# Patient Record
Sex: Female | Born: 1960 | Race: White | Hispanic: No | Marital: Single | Smoking: Current every day smoker
Health system: Southern US, Community
[De-identification: ages and names within clinical notes are randomized; demographics above are authoritative.]

## PROBLEM LIST (undated history)

## (undated) DIAGNOSIS — F419 Anxiety disorder, unspecified: Secondary | ICD-10-CM

## (undated) DIAGNOSIS — I10 Essential (primary) hypertension: Secondary | ICD-10-CM

## (undated) DIAGNOSIS — F32A Depression, unspecified: Secondary | ICD-10-CM

## (undated) DIAGNOSIS — F329 Major depressive disorder, single episode, unspecified: Secondary | ICD-10-CM

## (undated) HISTORY — PX: TUBAL LIGATION: SHX77

## (undated) HISTORY — PX: TONSILLECTOMY: SUR1361

## (undated) HISTORY — PX: INNER EAR SURGERY: SHX679

---

## 2011-12-22 ENCOUNTER — Emergency Department (HOSPITAL_COMMUNITY)
Admission: EM | Admit: 2011-12-22 | Discharge: 2011-12-22 | Disposition: A | Discharge status: Home / Self Care | Payer: Worker's Compensation | Attending: Emergency Medicine | Admitting: Emergency Medicine

## 2011-12-22 ENCOUNTER — Encounter (HOSPITAL_COMMUNITY): Payer: Self-pay

## 2011-12-22 DIAGNOSIS — S51809A Unspecified open wound of unspecified forearm, initial encounter: Secondary | ICD-10-CM | POA: Insufficient documentation

## 2011-12-22 DIAGNOSIS — W278XXA Contact with other nonpowered hand tool, initial encounter: Secondary | ICD-10-CM | POA: Insufficient documentation

## 2011-12-22 DIAGNOSIS — S51819A Laceration without foreign body of unspecified forearm, initial encounter: Secondary | ICD-10-CM

## 2011-12-22 DIAGNOSIS — Z23 Encounter for immunization: Secondary | ICD-10-CM | POA: Insufficient documentation

## 2011-12-22 DIAGNOSIS — I10 Essential (primary) hypertension: Secondary | ICD-10-CM | POA: Insufficient documentation

## 2011-12-22 DIAGNOSIS — Y9269 Other specified industrial and construction area as the place of occurrence of the external cause: Secondary | ICD-10-CM | POA: Insufficient documentation

## 2011-12-22 HISTORY — DX: Major depressive disorder, single episode, unspecified: F32.9

## 2011-12-22 HISTORY — DX: Depression, unspecified: F32.A

## 2011-12-22 HISTORY — DX: Essential (primary) hypertension: I10

## 2011-12-22 HISTORY — DX: Anxiety disorder, unspecified: F41.9

## 2011-12-22 MED ORDER — TETANUS-DIPHTH-ACELL PERTUSSIS 5-2.5-18.5 LF-MCG/0.5 IM SUSP
0.5000 mL | Freq: Once | INTRAMUSCULAR | Status: AC
  Administered 2011-12-22: 0.5 mL via INTRAMUSCULAR
  Filled 2011-12-22: qty 0.5

## 2011-12-22 MED ORDER — LIDOCAINE-EPINEPHRINE (PF) 1 %-1:200000 IJ SOLN
INTRAMUSCULAR | Status: AC
  Administered 2011-12-22: 10 mL via INTRAMUSCULAR
  Filled 2011-12-22: qty 10

## 2011-12-22 NOTE — ED Provider Notes (Signed)
Medical screening examination/treatment/procedure(s) were performed by non-physician practitioner and as supervising physician I was immediately available for consultation/collaboration.   Karimah Winquist W Payslie Mccaig, MD 12/22/11 1615 

## 2011-12-22 NOTE — ED Provider Notes (Signed)
History     CSN: [id number]  Arrival date & time 12/22/11  0801   First MD Initiated Contact with Carol Todd 12/22/11 [phone]      Chief Complaint  Carol Todd presents with  . Extremity Laceration    left forearm    (Consider location/radiation/quality/duration/timing/severity/associated sxs/prior treatment) Carol Todd is a 51 y.o. female presenting with skin laceration. The history is provided by the Carol Todd.  Laceration  The incident occurred less than 1 hour ago. The laceration is located on the right arm. The laceration is 3 cm in size. Injury mechanism: a scissors at work. The pain is at a severity of 3/10. The pain is mild. The pain has been constant since onset. She reports no foreign bodies present. Her tetanus status is out of date.    Past Medical History  Diagnosis Date  . Hypertension   . Depressed   . Anxiety     Past Surgical History  Procedure Date  . Inner ear surgery   . Tubal ligation   . Tonsillectomy     No family history on file.  History  Substance Use Topics  . Smoking status: Current Everyday Smoker  . Smokeless tobacco: Not on file  . Alcohol Use: Yes    OB History    Grav Para Term Preterm Abortions TAB SAB Ect Mult Living                  Review of Systems  Constitutional: Negative for fever.  HENT: Negative.  Negative for sore throat.   Eyes: Negative.   Respiratory: Negative for shortness of breath.   Cardiovascular: Negative for chest pain.  Gastrointestinal: Negative for abdominal pain.  Genitourinary: Negative.   Musculoskeletal: Negative for joint swelling and arthralgias.  Skin: Positive for wound.  Neurological: Negative for weakness and numbness.  Hematological: Negative.   Psychiatric/Behavioral: Negative.     Allergies  Review of Carol Todd's allergies indicates no known allergies.  Home Medications  No current outpatient prescriptions on file.  BP 162/92  Pulse 110  Temp(Src) 98.5 F (36.9 C) (Oral)  Resp 18  Ht 5\' 7"   (1.702 m)  Wt 200 lb (90.719 kg)  BMI 31.32 kg/m2  SpO2 97%  Physical Exam  Nursing note and vitals reviewed. Constitutional: She is oriented to person, place, and time. She appears well-developed and well-nourished.  HENT:  Head: Normocephalic and atraumatic.  Eyes: Conjunctivae are normal.  Neck: Neck supple.  Cardiovascular: Normal rate and intact distal pulses.        Vital signs reviewed,  Not tachy at time of exam.  Pulmonary/Chest: Effort normal.  Musculoskeletal: Normal range of motion.  Neurological: She is alert and oriented to person, place, and time.  Skin: Skin is warm and dry.       3 cm laceration left mid volar forearm.  Slight oozing blood from one end of wound.  Opposite end of wound moderate depth abrasion,  Hemostatic.  Psychiatric: She has a normal mood and affect.    ED Course  LACERATION REPAIR Date/Time: 12/22/2011 8:49 AM Performed by: Glynna Failla L Authorized by: Candis Musa Consent: Verbal consent obtained. Risks and benefits: risks, benefits and alternatives were discussed Consent given by: Carol Todd Carol Todd understanding: Carol Todd states understanding of the procedure being performed Time out: Immediately prior to procedure a "time out" was called to verify the correct Carol Todd, procedure, equipment, support staff and site/side marked as required. Body area: upper extremity Location details: left lower arm Laceration length: 3 cm Foreign  bodies: no foreign bodies Tendon involvement: none Nerve involvement: none Vascular damage: no Anesthesia: local infiltration Local anesthetic: lidocaine 2% with epinephrine Anesthetic total: 3 ml Preparation: Carol Todd was prepped and draped in the usual sterile fashion. Irrigation solution: saline Irrigation method: syringe Amount of cleaning: standard Debridement: none Degree of undermining: none Skin closure: 4-0 nylon Number of sutures: 6 Technique: simple Approximation: close Approximation difficulty:  simple Dressing: xeroform applied over wound due to abraded quality of  one end of the wound.   Carol Todd tolerance: Carol Todd tolerated the procedure well with no immediate complications.   (including critical care time)  Labs Reviewed - No data to display No results found.   1. Laceration of forearm without complication       MDM  Suture removal in 10 days,  Return sooner for any complications.        Candis Musa, PA 12/22/11 [phone]

## 2011-12-22 NOTE — Discharge Instructions (Signed)

## 2011-12-22 NOTE — ED Notes (Signed)
Pt at work and scissors "grazed my skin" laceration present to left forearm. Bleeding controlled at present moment.

## 2011-12-22 NOTE — ED Notes (Signed)
Discharge instructions reviewed with pt, questions answered. Pt verbalized understanding.  

## 2011-12-22 NOTE — ED Notes (Signed)
Pt taken to lab for drug screen.

## 2021-04-21 ENCOUNTER — Other Ambulatory Visit: Payer: Self-pay

## 2021-04-21 ENCOUNTER — Emergency Department (HOSPITAL_COMMUNITY)

## 2021-04-21 ENCOUNTER — Emergency Department (HOSPITAL_COMMUNITY)
Admission: EM | Admit: 2021-04-21 | Discharge: 2021-04-22 | Disposition: A | Discharge status: Home / Self Care | Attending: Emergency Medicine | Admitting: Emergency Medicine

## 2021-04-21 DIAGNOSIS — Z7982 Long term (current) use of aspirin: Secondary | ICD-10-CM | POA: Diagnosis not present

## 2021-04-21 DIAGNOSIS — S82851A Displaced trimalleolar fracture of right lower leg, initial encounter for closed fracture: Secondary | ICD-10-CM | POA: Insufficient documentation

## 2021-04-21 DIAGNOSIS — Y92410 Unspecified street and highway as the place of occurrence of the external cause: Secondary | ICD-10-CM | POA: Insufficient documentation

## 2021-04-21 DIAGNOSIS — F17200 Nicotine dependence, unspecified, uncomplicated: Secondary | ICD-10-CM | POA: Insufficient documentation

## 2021-04-21 DIAGNOSIS — I10 Essential (primary) hypertension: Secondary | ICD-10-CM | POA: Insufficient documentation

## 2021-04-21 DIAGNOSIS — W1781XA Fall down embankment (hill), initial encounter: Secondary | ICD-10-CM | POA: Diagnosis not present

## 2021-04-21 DIAGNOSIS — S99911A Unspecified injury of right ankle, initial encounter: Secondary | ICD-10-CM | POA: Diagnosis present

## 2021-04-21 MED ORDER — FENTANYL CITRATE (PF) 100 MCG/2ML IJ SOLN
INTRAMUSCULAR | Status: AC | PRN
  Administered 2021-04-21: 100 ug via INTRAVENOUS

## 2021-04-21 MED ORDER — ETOMIDATE 2 MG/ML IV SOLN
INTRAVENOUS | Status: AC | PRN
  Administered 2021-04-21: 13.6 mg via INTRAVENOUS

## 2021-04-21 MED ORDER — FENTANYL CITRATE (PF) 100 MCG/2ML IJ SOLN
100.0000 ug | Freq: Once | INTRAMUSCULAR | Status: AC
  Administered 2021-04-21: 100 ug via INTRAVENOUS
  Filled 2021-04-21: qty 2

## 2021-04-21 MED ORDER — ETOMIDATE 2 MG/ML IV SOLN
0.1500 mg/kg | Freq: Once | INTRAVENOUS | Status: AC
  Administered 2021-04-21: 13.6 mg via INTRAVENOUS
  Filled 2021-04-21: qty 10

## 2021-04-21 MED ORDER — OXYCODONE-ACETAMINOPHEN 5-325 MG PO TABS: 1.0000 | ORAL_TABLET | Freq: Four times a day (QID) | ORAL | 0 refills | Status: DC | PRN

## 2021-04-21 MED ORDER — OXYCODONE-ACETAMINOPHEN 5-325 MG PO TABS: 1.0000 | ORAL_TABLET | ORAL | 0 refills | Status: DC | PRN

## 2021-04-21 NOTE — ED Provider Notes (Signed)
[name]   CSN: [id number] Arrival date & time: 04/21/21  1952     History Chief Complaint  Patient presents with   Marletta Lor    Carol Todd is a 60 y.o. female.  HPI Patient was standing on the side of the road, feeding deer when she slipped and injured her right ankle.  She denies other injuries.  She was unable to bear weight on the right foot secondary to pain after the accident.  Injury occurred just prior to arrival.  There are no other known active modifying factors.    Past Medical History:  Diagnosis Date   Anxiety    Depressed    Hypertension     There are no problems to display for this patient.   Past Surgical History:  Procedure Laterality Date   INNER EAR SURGERY     TONSILLECTOMY     TUBAL LIGATION       OB History   No obstetric history on file.     No family history on file.  Social History   Tobacco Use   Smoking status: Every Day  Substance Use Topics   Alcohol use: Yes   Drug use: No    Home Medications Prior to Admission medications   Medication Sig Start Date End Date Taking? Authorizing Provider  Aspirin-Salicylamide-Caffeine (BC HEADACHE POWDER PO) Take 1 Package by mouth daily.   Yes [provider]  Black Pepper-Turmeric (TURMERIC COMPLEX/BLACK PEPPER PO) Take by mouth.   Yes [provider]  co-enzyme Q-10 30 MG capsule Take 30 mg by mouth 3 (three) times daily.   Yes [provider]  Coconut Oil 1000 MG CAPS Take 1 capsule by mouth daily.   Yes [provider]  Multiple Vitamin (MULTIVITAMIN WITH MINERALS) TABS tablet Take 1 tablet by mouth daily.   Yes [provider]  oxyCODONE-acetaminophen (PERCOCET/ROXICET) 5-325 MG tablet Take 1 tablet by mouth every 6 (six) hours as needed for severe pain or moderate pain. 04/21/21  Yes Mancel Bale, MD  [facility] Wort 1000 MG CAPS Take 1 capsule by mouth daily.   Yes [provider]     Allergies    Patient has no known allergies.  Review of Systems   Review of Systems  All other systems reviewed and are negative.  Physical Exam Updated Vital Signs BP (!) 162/96   Pulse 89   Temp 99 F (37.2 C) (Oral)   Resp 18   Ht 5\' 7"  (1.702 m)   Wt 90.7 kg   SpO2 100%   BMI 31.32 kg/m   Physical Exam Vitals and nursing note reviewed.  Constitutional:      General: She is not in acute distress.    Appearance: She is well-developed. She is not ill-appearing, toxic-appearing or diaphoretic.  HENT:     Head: Normocephalic and atraumatic.     Right Ear: External ear normal.     Left Ear: External ear normal.  Eyes:     Conjunctiva/sclera: Conjunctivae normal.     Pupils: Pupils are equal, round, and reactive to light.  Neck:     Trachea: Phonation normal.  Cardiovascular:     Rate and Rhythm: Normal rate.  Pulmonary:     Effort: Pulmonary effort is normal.  Abdominal:     General: There is no distension.     Palpations: Abdomen is soft.  Musculoskeletal:     Cervical back: Normal range of motion and neck supple.  Comments: Tender and swollen right ankle.  Unable to move ankle secondary to pain.  No gross deformity.  Neurovascular intact distally in the right toes.  No other large joint injury of either arm or leg  Skin:    General: Skin is warm and dry.  Neurological:     Mental Status: She is alert and oriented to person, place, and time.     Cranial Nerves: No cranial nerve deficit.     Sensory: No sensory deficit.     Motor: No abnormal muscle tone.     Coordination: Coordination normal.  Psychiatric:        Mood and Affect: Mood normal.        Behavior: Behavior normal.        Thought Content: Thought content normal.        Judgment: Judgment normal.    ED Results / Procedures / Treatments   Labs (all labs ordered are listed, but only abnormal results are displayed) Labs Reviewed - No data to display  EKG None  Radiology DG Ankle  Complete Right  Result Date: 04/21/2021 CLINICAL DATA:  60 year old female with fall and trauma to the right ankle. EXAM: RIGHT ANKLE - COMPLETE 3+ VIEW COMPARISON:  None. FINDINGS: There is a fracture of the medial malleolus with approximately 8 mm lateral displacement of the distal fracture fragment. Fracture of the distal fibula with approximately 12 mm lateral displacement of the distal fracture fragment. There is mildly displaced fracture of the posterior malleolus. There is posterior tilting of the tibial plafond in relation to the talar dome with asymmetric widening of the anterior ankle mortise. The bones are well mineralized. No significant arthritic changes. There is soft tissue swelling of the ankle. No radiopaque foreign object or soft tissue gas. IMPRESSION: Displaced trimalleolar fracture with asymmetric widening of the anterior ankle mortise. Electronically Signed   By: Elgie CollardArash  Radparvar M.D.   On: 04/21/2021 20:41    Procedures .Sedation  Date/Time: 04/21/2021 11:08 PM Performed by: Mancel BaleWentz, Kiyon Fidalgo, MD Authorized by: Mancel BaleWentz, Gerturde Kuba, MD   Consent:    Consent obtained:  Written   Consent given by:  Patient   Risks discussed:  Respiratory compromise necessitating ventilatory assistance and intubation   Alternatives discussed:  Analgesia without sedation Universal protocol:    Immediately prior to procedure, a time out was called: yes   Indications:    Procedure performed:  Fracture reduction   Procedure necessitating sedation performed by:  Physician performing sedation Pre-sedation assessment:    Time since last food or drink:  5 hour   ASA classification: class 2 - patient with mild systemic disease     Mouth opening:  3 or more finger widths   Mallampati score:  II - soft palate, uvula, fauces visible   Neck mobility: normal     Pre-sedation assessments completed and reviewed: airway patency, cardiovascular function, hydration status, mental status and respiratory function      Pre-sedation assessments completed and reviewed: pre-procedure nausea and vomiting status not reviewed and pre-procedure pain level not reviewed     Pre-sedation assessment completed:  04/21/2021 11:50 PM Immediate pre-procedure details:    Reassessment: Patient reassessed immediately prior to procedure     Reviewed: vital signs     Verified: bag valve mask available, emergency equipment available, intubation equipment available, IV patency confirmed and oxygen available   Procedure details (see MAR for exact dosages):    Preoxygenation:  Nasal cannula   Sedation:  Etomidate   Analgesia:  Fentanyl   Intra-procedure monitoring:  Blood pressure monitoring, cardiac monitor, continuous capnometry, continuous pulse oximetry, frequent LOC assessments and frequent vital sign checks   Intra-procedure events: none     Total Provider sedation time (minutes):  15 Post-procedure details:    Post-sedation assessment completed:  04/21/2021 11:09 PM   Attendance: Constant attendance by certified staff until patient recovered     Recovery: Patient returned to pre-procedure baseline     Post-sedation assessments completed and reviewed: airway patency, cardiovascular function, hydration status, mental status and respiratory function     Post-sedation assessments completed and reviewed: post-procedure nausea and vomiting status not reviewed and pain score not reviewed     Patient is stable for discharge or admission: yes     Procedure completion:  Tolerated well, no immediate complications .Ortho Injury Treatment  Date/Time: 04/21/2021 11:10 PM Performed by: Mancel Bale, MD Authorized by: Mancel Bale, MD   Consent:    Consent obtained:  Written   Consent given by:  Patient   Risks discussed:  Fracture   Alternatives discussed:  No treatmentInjury location: ankle Location details: left ankle Injury type: fracture-dislocation Fracture type: trimalleolar Pre-procedure neurovascular assessment:  neurovascularly intact Pre-procedure distal perfusion: normal Pre-procedure neurological function: normal Pre-procedure range of motion: normal Manipulation performed: yes Immobilization: splint Splint type: ankle stirrup and long leg Splint Applied by: ED Provider Supplies used: cotton padding and Ortho-Glass Post-procedure neurovascular assessment: post-procedure neurovascularly intact Post-procedure distal perfusion: normal Post-procedure neurological function: normal Post-procedure range of motion: normal     Medications Ordered in ED Medications  etomidate (AMIDATE) injection 13.6 mg (13.6 mg Intravenous Given 04/21/21 2255)  fentaNYL (SUBLIMAZE) injection 100 mcg (100 mcg Intravenous Given 04/21/21 2256)  fentaNYL (SUBLIMAZE) injection (100 mcg Intravenous Given 04/21/21 2257)  etomidate (AMIDATE) injection (13.6 mg Intravenous Given 04/21/21 2300)    ED Course  I have reviewed the triage vital signs and the nursing notes.  Pertinent labs & imaging results that were available during my care of the patient were reviewed by me and considered in my medical decision making (see chart for details).    MDM Rules/Calculators/A&P                            Patient Vitals for the past 24 hrs:  BP Temp Temp src Pulse Resp SpO2 Height Weight  04/21/21 2315 (!) 162/96 -- -- 89 18 100 % -- --  04/21/21 2311 (!) 195/83 -- -- 85 19 97 % -- --  04/21/21 2308 -- -- -- 85 -- 99 % -- --  04/21/21 2306 (!) 195/87 -- -- 93 -- 97 % -- --  04/21/21 2304 -- -- -- 88 -- 98 % -- --  04/21/21 2303 -- -- -- (!) 105 -- 100 % -- --  04/21/21 2300 (!) 198/172 -- -- 99 (!) 121 98 % -- --  04/21/21 2239 (!) 172/78 -- -- 96 -- 99 % -- --  04/21/21 2224 (!) 170/90 -- -- 89 20 100 % -- --  04/21/21 2215 (!) 170/90 -- -- 89 -- 93 % -- --  04/21/21 2001 (!) 187/90 99 F (37.2 C) Oral 88 18 97 % -- --  04/21/21 1954 -- -- -- -- -- -- 5\' 7"  (1.702 m) 90.7 kg    Medical Decision Making:  This patient  is presenting for evaluation of right ankle injury, which does require a range of treatment options, and is a complaint that involves  a moderate risk of morbidity and mortality. The differential diagnoses include fracture, contusion, sprain. I decided to review old records, and in summary healthy middle-aged female presenting with isolated injury to the right ankle caused by a slip and fall.  I did not require additional historical information from anyone.   Radiologic Tests Ordered, included right ankle x-ray, CT ankle post reduction.  I independently Visualized: Radiograph images, which show trimalleolar fracture   Critical Interventions-clinical evaluation, radiography, discussion with orthopedics, reduction of fracture dislocation, additional imaging  After These Interventions, the Patient was reevaluated and was found with trimalleolar fracture right, requiring reduction.  CRITICAL CARE-no Performed by: Mancel Bale  Nursing Notes Reviewed/ Care Coordinated Applicable Imaging Reviewed Interpretation of Laboratory Data incorporated into ED treatment   Disposition by oncoming team following CT imaging.  Anticipate discharge home, nonweightbearing and follow-up with orthopedics in the next day or 2    Final Clinical Impression(s) / ED Diagnoses Final diagnoses:  Closed trimalleolar fracture of right ankle, initial encounter    Rx / DC Orders ED Discharge Orders          Ordered    oxyCODONE-acetaminophen (PERCOCET) 5-325 MG tablet  Every 4 hours PRN,   Status:  Discontinued        04/21/21 2336    oxyCODONE-acetaminophen (PERCOCET/ROXICET) 5-325 MG tablet  Every 6 hours PRN        04/21/21 2338             Mancel Bale, MD 04/22/21 1119

## 2021-04-21 NOTE — ED Triage Notes (Signed)
BIB [facility] after sliding down a hill while feeding the deer. Obvious deformity to R ankle, splinted with EMS.

## 2021-04-21 NOTE — ED Notes (Signed)
Pt back from CT

## 2021-04-21 NOTE — ED Notes (Signed)
Patient transported to CT 

## 2021-04-21 NOTE — Discharge Instructions (Addendum)
Keep the right ankle elevate above your heart as much as possible. Call the orthopedic doctor, tomorrow morning to arrange a follow-up appointment for further care and treatment.  You will likely need surgery.

## 2021-04-22 NOTE — ED Provider Notes (Signed)
  Physical Exam  BP (!) 165/78   Pulse 71   Temp 99 F (37.2 C) (Oral)   Resp 18   Ht 5\' 7"  (1.702 m)   Wt 90.7 kg   SpO2 99%   BMI 31.32 kg/m   Physical Exam Vitals and nursing note reviewed.  Constitutional:      Appearance: Normal appearance.  HENT:     Head: Normocephalic.  Pulmonary:     Effort: Pulmonary effort is normal.  Musculoskeletal:     Comments: Motor, sensation, and capillary refill intact to all toes.  Neurological:     Mental Status: She is alert.    ED Course/Procedures     Procedures  MDM  Care assumed from Dr. at shift change.  Patient awaiting results of a CT scan which was obtained at the request of the orthopedic surgeon for her trimalleolar ankle fracture.  This has been performed and shows some improvement of the fracture.  Patient reassessed and tells me her foot actually feels better.  She is able to move her toes without discomfort.  She has sensation and capillary refill is brisk to all toes.  Patient seems appropriate for discharge with orthopedic follow-up.       Effie Shy, MD 04/22/21 [id number]

## 2021-04-26 ENCOUNTER — Ambulatory Visit: Payer: Self-pay | Admitting: Orthopedic Surgery

## 2021-04-30 ENCOUNTER — Encounter: Payer: Self-pay | Admitting: Orthopedic Surgery

## 2021-04-30 ENCOUNTER — Other Ambulatory Visit: Payer: Self-pay

## 2021-04-30 ENCOUNTER — Ambulatory Visit (INDEPENDENT_AMBULATORY_CARE_PROVIDER_SITE_OTHER): Admitting: Orthopedic Surgery

## 2021-04-30 VITALS — BP 146/70 | HR 98

## 2021-04-30 DIAGNOSIS — S82851A Displaced trimalleolar fracture of right lower leg, initial encounter for closed fracture: Secondary | ICD-10-CM | POA: Diagnosis not present

## 2021-05-01 ENCOUNTER — Encounter: Payer: Self-pay | Admitting: Orthopedic Surgery

## 2021-05-01 NOTE — H&P (View-Only) (Signed)
New Patient Visit  Assessment: Carol Todd is a 60 y.o. female with the following: Right trimalleolar ankle fracture  Plan: Radiographs were reviewed with the patient in clinic today.  She had a reduction in the emergency department of her right ankle fracture dislocation.  She feels well overall.  Her pain is controlled.  Based on the radiographs of the injury, I recommended surgery.  She is in agreement with this plan.  Risks and benefits of surgery, including, but not limited to infection, bleeding, persistent pain, damage to surrounding structures, need for further surgery, malunion, nonunion and more severe complications associated with anesthesia were discussed.  All questions have been answered and she has elected to proceed with surgery.   Patient is a smoker.  She is aware that this can increase the risk for nonunion or poor result overall.  She states she will work to decrease the amount she is smoking around the time of surgery.  Surgical Plan  Procedure: Operative fixation of right trimalleolar ankle fracture Disposition: Ambulatory Anesthesia: General with a block Medical Comorbidities: Tobacco use; no additional medical issues, although she has not followed up with a medical doctor in several years.     Follow-up: Return for After surgery; DOS 05/10/2021.  Subjective:  Chief Complaint  Patient presents with   Ankle Injury    DOI 04/21/21/fx ankle right    History of Present Illness: Carol Todd is a 60 y.o. female who presents for evaluation after a right ankle injury.  Approximately 1 week ago, she states that she was walking down a hill to feed some deer [place] a small farm, when she slipped and fell.  She noted immediate pain and deformity to the right ankle.  She presents to the emergency department for evaluation, where she was diagnosed with a fracture dislocation.  Her right ankle was reduced and placed in a splint.  Since then, she has done well.  Her pain  has improved.  She has not been bearing weight.  No issues with the splint.  No injuries elsewhere.   Review of Systems: No fevers or chills No numbness or tingling No chest pain No shortness of breath No bowel or bladder dysfunction No GI distress No headaches   Medical History:  Past Medical History:  Diagnosis Date   Anxiety    Depressed    Hypertension     Past Surgical History:  Procedure Laterality Date   INNER EAR SURGERY     TONSILLECTOMY     TUBAL LIGATION      No family history on file. Social History   Tobacco Use   Smoking status: Every Day   Smokeless tobacco: Never  Substance Use Topics   Alcohol use: Yes   Drug use: No    No Known Allergies  Current Meds  Medication Sig   Aspirin-Salicylamide-Caffeine (BC HEADACHE POWDER PO) Take 1 Package by mouth daily.   Black Pepper-Turmeric (TURMERIC COMPLEX/BLACK PEPPER PO) Take by mouth.   co-enzyme Q-10 30 MG capsule Take 30 mg by mouth 3 (three) times daily.   Coconut Oil 1000 MG CAPS Take 1 capsule by mouth daily.   Multiple Vitamin (MULTIVITAMIN WITH MINERALS) TABS tablet Take 1 tablet by mouth daily.   oxyCODONE-acetaminophen (PERCOCET/ROXICET) 5-325 MG tablet Take 1 tablet by mouth every 6 (six) hours as needed for severe pain or moderate pain.   [place] Wort 1000 MG CAPS Take 1 capsule by mouth daily.    Objective: BP (!) 146/70   Pulse  98   Physical Exam:  General: Alert and oriented. and No acute distress. Gait: Unable to ambulate.  Evaluation of the right ankle demonstrates a splint that is clean, dry and intact.  Exposed toes are warm and well-perfused.  She has sensation to the distal aspect of her toes.  Active motion intact in the EHL and TA.  No skin breakdown at the edges of the splint.  Some ecchymosis is appreciated over the anterior tibia.  IMAGING: I personally reviewed images previously obtained from the ED  X-ray and CT scan of the right ankle demonstrates a displaced  trimalleolar ankle fracture.  There is a large posterior malleolus fragment.  Post reduction imaging demonstrates adequate alignment.  New Medications:  No orders of the defined types were placed in this encounter.     Oliver Barre, MD  05/01/2021 7:36 AM

## 2021-05-01 NOTE — Progress Notes (Signed)
New Patient Visit  Assessment: Carol Todd is a 60 y.o. female with the following: Right trimalleolar ankle fracture  Plan: Radiographs were reviewed with the patient in clinic today.  She had a reduction in the emergency department of her right ankle fracture dislocation.  She feels well overall.  Her pain is controlled.  Based on the radiographs of the injury, I recommended surgery.  She is in agreement with this plan.  Risks and benefits of surgery, including, but not limited to infection, bleeding, persistent pain, damage to surrounding structures, need for further surgery, malunion, nonunion and more severe complications associated with anesthesia were discussed.  All questions have been answered and she has elected to proceed with surgery.   Patient is a smoker.  She is aware that this can increase the risk for nonunion or poor result overall.  She states she will work to decrease the amount she is smoking around the time of surgery.  Surgical Plan  Procedure: Operative fixation of right trimalleolar ankle fracture Disposition: Ambulatory Anesthesia: General with a block Medical Comorbidities: Tobacco use; no additional medical issues, although she has not followed up with a medical doctor in several years.     Follow-up: Return for After surgery; DOS 05/10/2021.  Subjective:  Chief Complaint  Patient presents with   Ankle Injury    DOI 04/21/21/fx ankle right    History of Present Illness: Carol Todd is a 60 y.o. female who presents for evaluation after a right ankle injury.  Approximately 1 week ago, she states that she was walking [place] when she slipped and fell.  She noted immediate pain and deformity to the right ankle.  She presents to the emergency department for evaluation, where she was diagnosed with a fracture dislocation.  Her right ankle was reduced and placed in a splint.  Since then, she has done well.  Her pain  has improved.  She has not been bearing weight.  No issues with the splint.  No injuries elsewhere.   Review of Systems: No fevers or chills No numbness or tingling No chest pain No shortness of breath No bowel or bladder dysfunction No GI distress No headaches   Medical History:  Past Medical History:  Diagnosis Date   Anxiety    Depressed    Hypertension     Past Surgical History:  Procedure Laterality Date   INNER EAR SURGERY     TONSILLECTOMY     TUBAL LIGATION      No family history on file. Social History   Tobacco Use   Smoking status: Every Day   Smokeless tobacco: Never  Substance Use Topics   Alcohol use: Yes   Drug use: No    No Known Allergies  Current Meds  Medication Sig   Aspirin-Salicylamide-Caffeine (BC HEADACHE POWDER PO) Take 1 Package by mouth daily.   Black Pepper-Turmeric (TURMERIC COMPLEX/BLACK PEPPER PO) Take by mouth.   co-enzyme Q-10 30 MG capsule Take 30 mg by mouth 3 (three) times daily.   Coconut Oil 1000 MG CAPS Take 1 capsule by mouth daily.   Multiple Vitamin (MULTIVITAMIN WITH MINERALS) TABS tablet Take 1 tablet by mouth daily.   oxyCODONE-acetaminophen (PERCOCET/ROXICET) 5-325 MG tablet Take 1 tablet by mouth every 6 (six) hours as needed for severe pain or moderate pain.   St Johns Wort 1000 MG CAPS Take 1 capsule by mouth daily.    Objective: BP (!) 146/70   Pulse  98   Physical Exam:  General: Alert and oriented. and No acute distress. Gait: Unable to ambulate.  Evaluation of the right ankle demonstrates a splint that is clean, dry and intact.  Exposed toes are warm and well-perfused.  She has sensation to the distal aspect of her toes.  Active motion intact in the EHL and TA.  No skin breakdown at the edges of the splint.  Some ecchymosis is appreciated over the anterior tibia.  IMAGING: I personally reviewed images previously obtained from the ED  X-ray and CT scan of the right ankle demonstrates a displaced  trimalleolar ankle fracture.  There is a large posterior malleolus fragment.  Post reduction imaging demonstrates adequate alignment.  New Medications:  No orders of the defined types were placed in this encounter.     [place] Hair A Theadore Blunck, MD  05/01/2021 7:36 AM    

## 2021-05-03 NOTE — Patient Instructions (Signed)
Your procedure is scheduled on: 05/10/2021  Report to [facility] Main Entrance at   6:15  AM.  Call this number if you have problems the morning of surgery: [phone]   Remember:   Do not Eat or Drink after midnight         No Smoking the morning of surgery  :  Take these medicines the morning of surgery with A SIP OF WATER: oxycodone if needed   Do not wear jewelry, make-up or nail polish.  Do not wear lotions, powders, or perfumes. You may wear deodorant.  Do not shave 48 hours prior to surgery. Men may shave face and neck.  Do not bring valuables to the hospital.  Contacts, dentures or bridgework may not be worn into surgery.  Leave suitcase in the car. After surgery it may be brought to your room.  For patients admitted to the hospital, checkout time is 11:00 AM the day of discharge.   Patients discharged the day of surgery will not be allowed to drive home.    Special Instructions: Shower using CHG night before surgery and shower the day of surgery use CHG.  Use special wash - you have one bottle of CHG for all showers.  You should use approximately 1/2 of the bottle for each shower.  How to Use Chlorhexidine for Bathing Chlorhexidine gluconate (CHG) is a germ-killing (antiseptic) solution that is used to clean the skin. It can get rid of the bacteria that normally live on the skin and can keep them away for about 24 hours. To clean your skin with CHG, you may be given: A CHG solution to use in the shower or as part of a sponge bath. A prepackaged cloth that contains CHG. Cleaning your skin with CHG may help lower the risk for infection: While you are staying in the intensive care unit of the hospital. If you have a vascular access, such as a central line, to provide short-term or long-term access to your veins. If you have a catheter to drain urine from your bladder. If you are on a ventilator. A ventilator is a machine that helps you breathe by moving air in and out of your  lungs. After surgery. What are the risks? Risks of using CHG include: A skin reaction. Hearing loss, if CHG gets in your ears. Eye injury, if CHG gets in your eyes and is not rinsed out. The CHG product catching fire. Make sure that you avoid smoking and flames after applying CHG to your skin. Do not use CHG: If you have a chlorhexidine allergy or have previously reacted to chlorhexidine. On babies younger than 39 months of age. How to use CHG solution Use CHG only as told by your health care provider, and follow the instructions on the label. Use the full amount of CHG as directed. Usually, this is one bottle. During a shower Follow these steps when using CHG solution during a shower (unless your health care provider gives you different instructions): Start the shower. Use your normal soap and shampoo to wash your face and hair. Turn off the shower or move out of the shower stream. Pour the CHG onto a clean washcloth. Do not use any type of brush or rough-edged sponge. Starting at your neck, lather your body down to your toes. Make sure you follow these instructions: If you will be having surgery, pay special attention to the part of your body where you will be having surgery. Scrub this area for at  least 1 minute. Do not use CHG on your head or face. If the solution gets into your ears or eyes, rinse them well with water. Avoid your genital area. Avoid any areas of skin that have broken skin, cuts, or scrapes. Scrub your back and under your arms. Make sure to wash skin folds. Let the lather sit on your skin for 1-2 minutes or as long as told by your health care provider. Thoroughly rinse your entire body in the shower. Make sure that all body creases and crevices are rinsed well. Dry off with a clean towel. Do not put any substances on your body afterward--such as powder, lotion, or perfume--unless you are told to do so by your health care provider. Only use lotions that are recommended  by the manufacturer. Put on clean clothes or pajamas. If it is the night before your surgery, sleep in clean sheets.  During a sponge bath Follow these steps when using CHG solution during a sponge bath (unless your health care provider gives you different instructions): Use your normal soap and shampoo to wash your face and hair. Pour the CHG onto a clean washcloth. Starting at your neck, lather your body down to your toes. Make sure you follow these instructions: If you will be having surgery, pay special attention to the part of your body where you will be having surgery. Scrub this area for at least 1 minute. Do not use CHG on your head or face. If the solution gets into your ears or eyes, rinse them well with water. Avoid your genital area. Avoid any areas of skin that have broken skin, cuts, or scrapes. Scrub your back and under your arms. Make sure to wash skin folds. Let the lather sit on your skin for 1-2 minutes or as long as told by your health care provider. Using a different clean, wet washcloth, thoroughly rinse your entire body. Make sure that all body creases and crevices are rinsed well. Dry off with a clean towel. Do not put any substances on your body afterward--such as powder, lotion, or perfume--unless you are told to do so by your health care provider. Only use lotions that are recommended by the manufacturer. Put on clean clothes or pajamas. If it is the night before your surgery, sleep in clean sheets. How to use CHG prepackaged cloths Only use CHG cloths as told by your health care provider, and follow the instructions on the label. Use the CHG cloth on clean, dry skin. Do not use the CHG cloth on your head or face unless your health care provider tells you to. When washing with the CHG cloth: Avoid your genital area. Avoid any areas of skin that have broken skin, cuts, or scrapes. Before surgery Follow these steps when using a CHG cloth to clean before surgery  (unless your health care provider gives you different instructions): Using the CHG cloth, vigorously scrub the part of your body where you will be having surgery. Scrub using a back-and-forth motion for 3 minutes. The area on your body should be completely wet with CHG when you are done scrubbing. Do not rinse. Discard the cloth and let the area air-dry. Do not put any substances on the area afterward, such as powder, lotion, or perfume. Put on clean clothes or pajamas. If it is the night before your surgery, sleep in clean sheets.  For general bathing Follow these steps when using CHG cloths for general bathing (unless your health care provider gives you different instructions).  Use a separate CHG cloth for each area of your body. Make sure you wash between any folds of skin and between your fingers and toes. Wash your body in the following order, switching to a new cloth after each step: The front of your neck, shoulders, and chest. Both of your arms, under your arms, and your hands. Your stomach and groin area, avoiding the genitals. Your right leg and foot. Your left leg and foot. The back of your neck, your back, and your buttocks. Do not rinse. Discard the cloth and let the area air-dry. Do not put any substances on your body afterward--such as powder, lotion, or perfume--unless you are told to do so by your health care provider. Only use lotions that are recommended by the manufacturer. Put on clean clothes or pajamas. Contact a health care provider if: Your skin gets irritated after scrubbing. You have questions about using your solution or cloth. Get help right away if: Your eyes become very red or swollen. Your eyes itch badly. Your skin itches badly and is red or swollen. Your hearing changes. You have trouble seeing. You have swelling or tingling in your mouth or throat. You have trouble breathing. You swallow any chlorhexidine. Summary Chlorhexidine gluconate (CHG) is a  germ-killing (antiseptic) solution that is used to clean the skin. Cleaning your skin with CHG may help to lower your risk for infection. You may be given CHG to use for bathing. It may be in a bottle or in a prepackaged cloth to use on your skin. Carefully follow your health care provider's instructions and the instructions on the product label. Do not use CHG if you have a chlorhexidine allergy. Contact your health care provider if your skin gets irritated after scrubbing. This information is not intended to replace advice given to you by your health care provider. Make sure you discuss any questions you have with your healthcare provider. Document Revised: 01/24/2020 Document Reviewed: 02/28/2020 Elsevier Patient Education  2022 [employer] Displaced Trimalleolar Ankle Fracture Treated With ORIF, Care After This sheet gives you information about how to care for yourself after your procedure. Your health care provider may also give you more specific instructions. If you have problems or questions, contact your health careprovider. What can I expect after the procedure? After the procedure, it is common to have: Pain. Swelling. A small amount of fluid from your incision. Follow these instructions at home: If you have a splint or boot: Wear the splint or boot as told by your health care provider. Remove it only as told by your health care provider. Loosen the splint or boot if your toes tingle, become numb, or turn cold and blue. Keep the splint or boot clean and dry. If you have a cast: Do not stick anything inside the cast to scratch your skin. Doing that increases your risk of infection. Check the skin around the cast every day. Tell your health care provider about any concerns. You may put lotion on dry skin around the edges of the cast. Do not put lotion on the skin underneath the cast. Keep the cast clean and dry. Bathing Do not take baths, swim, or use a hot tub until your health  care provider approves. Ask your health care provider if you can take showers. If your splint, boot, or cast is not waterproof: Do not let it get wet. Cover it with a watertight covering when you take a bath or a shower. Keep your bandage (dressing) dry until your  health care provider says it can be removed. Incision care  Follow instructions from your health care provider about how to take care of your incision. Make sure you: Wash your hands with soap and water for at least 20 seconds before and after you change your dressing. If soap and water are not available, use hand sanitizer. Change your dressing as told by your health care provider. Leave stitches (sutures), skin glue, or adhesive strips in place. These skin closures may need to stay in place for 2 weeks or longer. If adhesive strip edges start to loosen and curl up, you may trim the loose edges. Do not remove adhesive strips completely unless your health care provider tells you to do that. Check your incision area every day for signs of infection. Check for: Redness. More pain or swelling. Blood or more fluid. Warmth. Pus or a bad smell.  Managing pain, stiffness, and swelling  If directed, put ice on the affected area. To do this: If you have a removable splint or boot, remove it as told by your health care provider. Put ice in a plastic bag. Place a towel between your skin and the bag or between your cast and the bag. Leave the ice on for 20 minutes, 2-3 times a day. Remove the ice if your skin turns bright red. This is very important. If you cannot feel pain, heat, or cold, you have a greater risk of damage to the area. Move your toes often to reduce stiffness and swelling. Raise (elevate) the injured area above the level of your heart while you are sitting or lying down. To do this, try putting a few pillows under your leg and ankle.  Activity Do exercises as told by your health care provider or physical therapist. Do  not use your injured limb to support (bear) your body weight until your health care provider says that you can. Follow weight-bearing restrictions as told. Use crutches or other devices to help you move around (assistive devices) as told by your health care provider. Ask your health care provider when it is safe to drive if you have a splint, boot, or cast on your foot. Return to your normal activities as told by your health care provider. Ask your health care provider what activities are safe for you. General instructions Do not put pressure on any part of the splint or cast until it is fully hardened. This may take several hours. Do not use any products that contain nicotine or tobacco, such as cigarettes and e-cigarettes. These can delay bone healing. If you need help quitting, ask your health care provider. Take over-the-counter and prescription medicines only as told by your health care provider. Ask your health care provider if the medicine prescribed to you: Requires you to avoid driving or using machinery. Can cause constipation. You may need to take these actions to prevent or treat constipation: Drink enough fluid to keep your urine pale yellow. Take over-the-counter or prescription medicines. Eat foods that are high in fiber, such as beans, whole grains, and fresh fruits and vegetables. Limit foods that are high in fat and processed sugars, such as fried or sweet foods. Keep all follow-up visits. This is important. Contact a health care provider if: You have a fever. Your pain medicine is not helping. You have redness around your incision. You have more pain or swelling around your incision. You have blood or more fluid coming from your incision or leaking through your cast. Your incision feels warm  to the touch. You have pus or a bad smell coming from your incision or from your cast or dressing. Get help right away if: The edges of your incision come apart after the sutures or  staples have been taken out. You have chest pain or trouble breathing. You have numbness or tingling in your foot or leg. Your foot becomes cold, pale, or blue. You have pain or swelling in your calf. These symptoms may represent a serious problem that is an emergency. Do not wait to see if the symptoms will go away. Get medical help right away. Call your local emergency services (911 in the [place] Do not drive yourself to the hospital. Summary After the procedure, it is common to have some pain and swelling. If your splint, boot, or cast is not waterproof, do not let it get wet. Contact your health care provider if you have more pain or swelling, or if you have blood or more fluid coming from your incision or leaking through your cast. Get help right away if you have numbness or tingling in your foot or leg, or if your foot becomes cold, pale, or blue. This information is not intended to replace advice given to you by your health care provider. Make sure you discuss any questions you have with your healthcare provider. Document Revised: 01/13/2020 Document Reviewed: 01/13/2020 Elsevier Patient Education  2022 [employer] General Anesthesia, Adult, Care After This sheet gives you information about how to care for yourself after your procedure. Your health care provider may also give you more specific instructions. If you have problems or questions, contact your health careprovider. What can I expect after the procedure? After the procedure, the following side effects are common: Pain or discomfort at the IV site. Nausea. Vomiting. Sore throat. Trouble concentrating. Feeling cold or chills. Feeling weak or tired. Sleepiness and fatigue. Soreness and body aches. These side effects can affect parts of the body that were not involved in surgery. Follow these instructions at home: For the time period you were told by your health care provider:  Rest. Do not participate in activities  where you could fall or become injured. Do not drive or use machinery. Do not drink alcohol. Do not take sleeping pills or medicines that cause drowsiness. Do not make important decisions or sign legal documents. Do not take care of children on your own.  Eating and drinking Follow any instructions from your health care provider about eating or drinking restrictions. When you feel hungry, start by eating small amounts of foods that are soft and easy to digest (bland), such as toast. Gradually return to your regular diet. Drink enough fluid to keep your urine pale yellow. If you vomit, rehydrate by drinking water, juice, or clear broth. General instructions If you have sleep apnea, surgery and certain medicines can increase your risk for breathing problems. Follow instructions from your health care provider about wearing your sleep device: Anytime you are sleeping, including during daytime naps. While taking prescription pain medicines, sleeping medicines, or medicines that make you drowsy. Have a responsible adult stay with you for the time you are told. It is important to have someone help care for you until you are awake and alert. Return to your normal activities as told by your health care provider. Ask your health care provider what activities are safe for you. Take over-the-counter and prescription medicines only as told by your health care provider. If you smoke, do not smoke without supervision. Keep all follow-up  visits as told by your health care provider. This is important. Contact a health care provider if: You have nausea or vomiting that does not get better with medicine. You cannot eat or drink without vomiting. You have pain that does not get better with medicine. You are unable to pass urine. You develop a skin rash. You have a fever. You have redness around your IV site that gets worse. Get help right away if: You have difficulty breathing. You have chest pain. You  have blood in your urine or stool, or you vomit blood. Summary After the procedure, it is common to have a sore throat or nausea. It is also common to feel tired. Have a responsible adult stay with you for the time you are told. It is important to have someone help care for you until you are awake and alert. When you feel hungry, start by eating small amounts of foods that are soft and easy to digest (bland), such as toast. Gradually return to your regular diet. Drink enough fluid to keep your urine pale yellow. Return to your normal activities as told by your health care provider. Ask your health care provider what activities are safe for you. This information is not intended to replace advice given to you by your health care provider. Make sure you discuss any questions you have with your healthcare provider. Document Revised: 05/28/2020 Document Reviewed: 12/26/2019 Elsevier Patient Education  2022 [employer].

## 2021-05-07 ENCOUNTER — Encounter (HOSPITAL_COMMUNITY)
Admission: RE | Admit: 2021-05-07 | Discharge: 2021-05-07 | Disposition: A | Discharge status: Home / Self Care | Source: Ambulatory Visit | Attending: Orthopedic Surgery | Admitting: Orthopedic Surgery

## 2021-05-07 ENCOUNTER — Encounter (HOSPITAL_COMMUNITY): Payer: Self-pay

## 2021-05-07 ENCOUNTER — Other Ambulatory Visit: Payer: Self-pay

## 2021-05-07 DIAGNOSIS — Z01812 Encounter for preprocedural laboratory examination: Secondary | ICD-10-CM | POA: Diagnosis present

## 2021-05-07 LAB — BASIC METABOLIC PANEL
Anion gap: 8 (ref 5–15)
BUN: 12 mg/dL (ref 6–20)
CO2: 27 mmol/L (ref 22–32)
Calcium: 9.4 mg/dL (ref 8.9–10.3)
Chloride: 106 mmol/L (ref 98–111)
Creatinine, Ser: 0.75 mg/dL (ref 0.44–1.00)
GFR, Estimated: >60 mL/min (ref >60)
Glucose, Bld: 98 mg/dL (ref 70–99)
Potassium: 3.8 mmol/L (ref 3.5–5.1)
Sodium: 141 mmol/L (ref 135–145)

## 2021-05-07 LAB — CBC
HCT: 43.3 % (ref 36.0–46.0)
Hemoglobin: 14.3 g/dL (ref 12.0–15.0)
MCH: 31.4 pg (ref 26.0–34.0)
MCHC: 33 g/dL (ref 30.0–36.0)
MCV: 95 fL (ref 80.0–100.0)
Platelets: 307 10*3/uL (ref 150–400)
RBC: 4.56 MIL/uL (ref 3.87–5.11)
RDW: 12.7 % (ref 11.5–15.5)
WBC: 7 10*3/uL (ref 4.0–10.5)
nRBC: 0 % (ref 0.0–0.2)

## 2021-05-10 ENCOUNTER — Ambulatory Visit (HOSPITAL_COMMUNITY)
Admission: RE | Admit: 2021-05-10 | Discharge: 2021-05-10 | Disposition: A | Discharge status: Home / Self Care | Attending: Orthopedic Surgery | Admitting: Orthopedic Surgery

## 2021-05-10 ENCOUNTER — Encounter (HOSPITAL_COMMUNITY)
Admission: RE | Disposition: A | Discharge status: Home / Self Care | Payer: Self-pay | Source: Home / Self Care | Attending: Orthopedic Surgery

## 2021-05-10 ENCOUNTER — Ambulatory Visit (HOSPITAL_COMMUNITY)

## 2021-05-10 ENCOUNTER — Ambulatory Visit (HOSPITAL_COMMUNITY): Admitting: Anesthesiology

## 2021-05-10 ENCOUNTER — Encounter (HOSPITAL_COMMUNITY): Payer: Self-pay | Admitting: Orthopedic Surgery

## 2021-05-10 DIAGNOSIS — Z7982 Long term (current) use of aspirin: Secondary | ICD-10-CM | POA: Diagnosis not present

## 2021-05-10 DIAGNOSIS — S82851A Displaced trimalleolar fracture of right lower leg, initial encounter for closed fracture: Secondary | ICD-10-CM

## 2021-05-10 DIAGNOSIS — S82853A Displaced trimalleolar fracture of unspecified lower leg, initial encounter for closed fracture: Secondary | ICD-10-CM

## 2021-05-10 DIAGNOSIS — F17210 Nicotine dependence, cigarettes, uncomplicated: Secondary | ICD-10-CM | POA: Insufficient documentation

## 2021-05-10 DIAGNOSIS — W010XXA Fall on same level from slipping, tripping and stumbling without subsequent striking against object, initial encounter: Secondary | ICD-10-CM | POA: Insufficient documentation

## 2021-05-10 DIAGNOSIS — S82891A Other fracture of right lower leg, initial encounter for closed fracture: Secondary | ICD-10-CM

## 2021-05-10 HISTORY — PX: ORIF ANKLE FRACTURE: SHX5408

## 2021-05-10 SURGERY — OPEN REDUCTION INTERNAL FIXATION (ORIF) ANKLE FRACTURE
Anesthesia: Regional | Site: Ankle | Laterality: Right

## 2021-05-10 MED ORDER — DEXMEDETOMIDINE (PRECEDEX) IN NS 20 MCG/5ML (4 MCG/ML) IV SYRINGE
PREFILLED_SYRINGE | INTRAVENOUS | Status: AC
  Filled 2021-05-10: qty 5

## 2021-05-10 MED ORDER — MEPERIDINE HCL 50 MG/ML IJ SOLN: 6.2500 mg | INTRAMUSCULAR | Status: DC | PRN

## 2021-05-10 MED ORDER — PROPOFOL 10 MG/ML IV BOLUS
INTRAVENOUS | Status: DC | PRN
  Administered 2021-05-10: 200 mg via INTRAVENOUS
  Administered 2021-05-10: 50 mg via INTRAVENOUS

## 2021-05-10 MED ORDER — MIDAZOLAM HCL 2 MG/2ML IJ SOLN
INTRAMUSCULAR | Status: AC
  Filled 2021-05-10: qty 2

## 2021-05-10 MED ORDER — FENTANYL CITRATE (PF) 100 MCG/2ML IJ SOLN: 50.0000 ug | INTRAMUSCULAR | Status: DC | PRN

## 2021-05-10 MED ORDER — SCOPOLAMINE 1 MG/3DAYS TD PT72
MEDICATED_PATCH | TRANSDERMAL | Status: AC
  Filled 2021-05-10: qty 1

## 2021-05-10 MED ORDER — PROPOFOL 10 MG/ML IV BOLUS
INTRAVENOUS | Status: AC
  Filled 2021-05-10: qty 40

## 2021-05-10 MED ORDER — ROCURONIUM BROMIDE 10 MG/ML (PF) SYRINGE
PREFILLED_SYRINGE | INTRAVENOUS | Status: AC
  Filled 2021-05-10: qty 10

## 2021-05-10 MED ORDER — SUGAMMADEX SODIUM 200 MG/2ML IV SOLN
INTRAVENOUS | Status: DC | PRN
  Administered 2021-05-10: 200 mg via INTRAVENOUS

## 2021-05-10 MED ORDER — BUPIVACAINE-EPINEPHRINE (PF) 0.5% -1:200000 IJ SOLN
INTRAMUSCULAR | Status: AC
  Filled 2021-05-10: qty 30

## 2021-05-10 MED ORDER — CHLORHEXIDINE GLUCONATE 0.12 % MT SOLN
15.0000 mL | Freq: Once | OROMUCOSAL | Status: AC
  Administered 2021-05-10: 15 mL via OROMUCOSAL
  Filled 2021-05-10: qty 15

## 2021-05-10 MED ORDER — SCOPOLAMINE 1 MG/3DAYS TD PT72
MEDICATED_PATCH | TRANSDERMAL | Status: DC | PRN
  Administered 2021-05-10: 1 via TRANSDERMAL

## 2021-05-10 MED ORDER — ONDANSETRON HCL 4 MG/2ML IJ SOLN
INTRAMUSCULAR | Status: AC
  Filled 2021-05-10: qty 2

## 2021-05-10 MED ORDER — LIDOCAINE HCL (PF) 1 % IJ SOLN
INTRAMUSCULAR | Status: AC
  Filled 2021-05-10: qty 30

## 2021-05-10 MED ORDER — ORAL CARE MOUTH RINSE: 15.0000 mL | Freq: Once | OROMUCOSAL | Status: AC

## 2021-05-10 MED ORDER — FENTANYL CITRATE (PF) 100 MCG/2ML IJ SOLN
INTRAMUSCULAR | Status: DC | PRN
  Administered 2021-05-10 (×3): 50 ug via INTRAVENOUS

## 2021-05-10 MED ORDER — DEXMEDETOMIDINE (PRECEDEX) IN NS 20 MCG/5ML (4 MCG/ML) IV SYRINGE
PREFILLED_SYRINGE | INTRAVENOUS | Status: DC | PRN
  Administered 2021-05-10: 20 ug via INTRAVENOUS

## 2021-05-10 MED ORDER — FENTANYL CITRATE (PF) 100 MCG/2ML IJ SOLN
INTRAMUSCULAR | Status: AC
  Filled 2021-05-10: qty 2

## 2021-05-10 MED ORDER — ONDANSETRON HCL 4 MG/2ML IJ SOLN
INTRAMUSCULAR | Status: DC | PRN
Start: 2021-05-10 — End: 2021-05-10
  Administered 2021-05-10: 4 mg via INTRAVENOUS

## 2021-05-10 MED ORDER — DEXAMETHASONE SODIUM PHOSPHATE 10 MG/ML IJ SOLN
INTRAMUSCULAR | Status: DC | PRN
  Administered 2021-05-10: 4 mg via INTRAVENOUS

## 2021-05-10 MED ORDER — ONDANSETRON HCL 4 MG/2ML IJ SOLN: 4.0000 mg | Freq: Once | INTRAMUSCULAR | Status: DC | PRN

## 2021-05-10 MED ORDER — BUPIVACAINE HCL (PF) 0.5 % IJ SOLN
INTRAMUSCULAR | Status: DC | PRN
  Administered 2021-05-10: 14 mL via PERINEURAL

## 2021-05-10 MED ORDER — MIDAZOLAM HCL 2 MG/2ML IJ SOLN
2.0000 mg | Freq: Once | INTRAMUSCULAR | Status: DC
  Filled 2021-05-10: qty 2

## 2021-05-10 MED ORDER — HYDROMORPHONE HCL 1 MG/ML IJ SOLN: 0.2500 mg | INTRAMUSCULAR | Status: DC | PRN

## 2021-05-10 MED ORDER — BUPIVACAINE-EPINEPHRINE (PF) 0.25% -1:200000 IJ SOLN
INTRAMUSCULAR | Status: DC | PRN
  Administered 2021-05-10 (×2): 15 mL via PERINEURAL

## 2021-05-10 MED ORDER — LIDOCAINE 2% (20 MG/ML) 5 ML SYRINGE
INTRAMUSCULAR | Status: DC | PRN
  Administered 2021-05-10: 100 mg via INTRAVENOUS

## 2021-05-10 MED ORDER — 0.9 % SODIUM CHLORIDE (POUR BTL) OPTIME
TOPICAL | Status: DC | PRN
  Administered 2021-05-10 (×2): 1000 mL

## 2021-05-10 MED ORDER — GLYCOPYRROLATE PF 0.2 MG/ML IJ SOSY
PREFILLED_SYRINGE | INTRAMUSCULAR | Status: AC
  Filled 2021-05-10: qty 1

## 2021-05-10 MED ORDER — CEFAZOLIN SODIUM-DEXTROSE 2-4 GM/100ML-% IV SOLN
2.0000 g | INTRAVENOUS | Status: AC
Start: 2021-05-10 — End: 2021-05-10
  Administered 2021-05-10: 2 g via INTRAVENOUS
  Filled 2021-05-10: qty 100

## 2021-05-10 MED ORDER — ASPIRIN EC 81 MG PO TBEC: 81.0000 mg | DELAYED_RELEASE_TABLET | Freq: Two times a day (BID) | ORAL | 0 refills | Status: AC

## 2021-05-10 MED ORDER — DEXAMETHASONE SODIUM PHOSPHATE 4 MG/ML IJ SOLN
INTRAMUSCULAR | Status: DC | PRN
  Administered 2021-05-10 (×2): 4 mg via PERINEURAL

## 2021-05-10 MED ORDER — ONDANSETRON HCL 4 MG PO TABS: 4.0000 mg | ORAL_TABLET | Freq: Three times a day (TID) | ORAL | 0 refills | Status: AC | PRN

## 2021-05-10 MED ORDER — LACTATED RINGERS IV SOLN
INTRAVENOUS | Status: DC
  Administered 2021-05-10: 1000 mL via INTRAVENOUS

## 2021-05-10 MED ORDER — PHENYLEPHRINE 40 MCG/ML (10ML) SYRINGE FOR IV PUSH (FOR BLOOD PRESSURE SUPPORT)
PREFILLED_SYRINGE | INTRAVENOUS | Status: AC
  Filled 2021-05-10: qty 10

## 2021-05-10 MED ORDER — KETOROLAC TROMETHAMINE 30 MG/ML IJ SOLN
INTRAMUSCULAR | Status: DC | PRN
  Administered 2021-05-10: 30 mg via INTRAVENOUS

## 2021-05-10 MED ORDER — ROCURONIUM BROMIDE 10 MG/ML (PF) SYRINGE
PREFILLED_SYRINGE | INTRAVENOUS | Status: DC | PRN
  Administered 2021-05-10: 10 mg via INTRAVENOUS
  Administered 2021-05-10: 60 mg via INTRAVENOUS
  Administered 2021-05-10: 10 mg via INTRAVENOUS
  Administered 2021-05-10: 20 mg via INTRAVENOUS

## 2021-05-10 MED ORDER — DEXAMETHASONE SODIUM PHOSPHATE 10 MG/ML IJ SOLN
INTRAMUSCULAR | Status: AC
  Filled 2021-05-10: qty 1

## 2021-05-10 MED ORDER — MIDAZOLAM HCL 5 MG/5ML IJ SOLN
INTRAMUSCULAR | Status: DC | PRN
  Administered 2021-05-10: 2 mg via INTRAVENOUS

## 2021-05-10 MED ORDER — DEXAMETHASONE SODIUM PHOSPHATE 4 MG/ML IJ SOLN
INTRAMUSCULAR | Status: AC
  Filled 2021-05-10: qty 2

## 2021-05-10 MED ORDER — BUPIVACAINE-EPINEPHRINE (PF) 0.25% -1:200000 IJ SOLN
INTRAMUSCULAR | Status: AC
  Filled 2021-05-10: qty 30

## 2021-05-10 MED ORDER — LIDOCAINE HCL (PF) 1 % IJ SOLN
INTRAMUSCULAR | Status: DC | PRN
  Administered 2021-05-10 (×2): 3 mL

## 2021-05-10 MED ORDER — OXYCODONE HCL 5 MG PO TABS: 5.0000 mg | ORAL_TABLET | ORAL | 0 refills | Status: AC | PRN

## 2021-05-10 MED ORDER — CELECOXIB 100 MG PO CAPS: 100.0000 mg | ORAL_CAPSULE | Freq: Every day | ORAL | 0 refills | Status: AC

## 2021-05-10 MED ORDER — KETOROLAC TROMETHAMINE 30 MG/ML IJ SOLN
INTRAMUSCULAR | Status: AC
  Filled 2021-05-10: qty 1

## 2021-05-10 MED ORDER — BUPIVACAINE-EPINEPHRINE (PF) 0.5% -1:200000 IJ SOLN
INTRAMUSCULAR | Status: DC | PRN
  Administered 2021-05-10: 10 mL via PERINEURAL

## 2021-05-10 MED ORDER — LIDOCAINE HCL (PF) 2 % IJ SOLN
INTRAMUSCULAR | Status: AC
  Filled 2021-05-10: qty 5

## 2021-05-10 MED ORDER — BUPIVACAINE HCL (PF) 0.5 % IJ SOLN
INTRAMUSCULAR | Status: AC
  Filled 2021-05-10: qty 30

## 2021-05-10 MED ORDER — ACETAMINOPHEN 500 MG PO TABS: 1000.0000 mg | ORAL_TABLET | Freq: Three times a day (TID) | ORAL | 0 refills | Status: AC

## 2021-05-10 MED ORDER — GLYCOPYRROLATE PF 0.2 MG/ML IJ SOSY
PREFILLED_SYRINGE | INTRAMUSCULAR | Status: DC | PRN
  Administered 2021-05-10 (×2): .1 mg via INTRAVENOUS

## 2021-05-10 MED ORDER — PHENYLEPHRINE 40 MCG/ML (10ML) SYRINGE FOR IV PUSH (FOR BLOOD PRESSURE SUPPORT)
PREFILLED_SYRINGE | INTRAVENOUS | Status: DC | PRN
  Administered 2021-05-10: 120 ug via INTRAVENOUS
  Administered 2021-05-10 (×4): 80 ug via INTRAVENOUS

## 2021-05-10 SURGICAL SUPPLY — 78 items
APL PRP STRL LF DISP 70% ISPRP (MISCELLANEOUS) ×2
BANDAGE ESMARK 4X12 BL STRL LF (DISPOSABLE) ×1 IMPLANT
BB-TAK SMALL (MISCELLANEOUS) ×2
BIT DRILL 1.7 (BIT) ×2
BIT DRILL 1.7X (BIT) ×1 IMPLANT
BIT DRILL 2 CANN GRADUATED (BIT) ×1 IMPLANT
BIT DRILL 2.5 CANN STRL (BIT) ×1 IMPLANT
BIT DRILL 2.6 CANN (BIT) ×1 IMPLANT
BLADE SURG SZ10 CARB STEEL (BLADE) ×2 IMPLANT
BNDG CMPR 12X4 ELC STRL LF (DISPOSABLE) ×1
BNDG CMPR STD VLCR NS LF 5.8X4 (GAUZE/BANDAGES/DRESSINGS) ×2
BNDG COHESIVE 4X5 TAN STRL (GAUZE/BANDAGES/DRESSINGS) ×2 IMPLANT
BNDG ELASTIC 4X5.8 VLCR NS LF (GAUZE/BANDAGES/DRESSINGS) ×3 IMPLANT
BNDG ESMARK 4X12 BLUE STRL LF (DISPOSABLE) ×2
CHLORAPREP W/TINT 26 (MISCELLANEOUS) ×3 IMPLANT
CLOTH BEACON ORANGE TIMEOUT ST (SAFETY) ×2 IMPLANT
COVER LIGHT HANDLE STERIS (MISCELLANEOUS) ×4 IMPLANT
COVER MAYO STAND STRL (DRAPES) ×1 IMPLANT
CUFF TOURN SGL QUICK 34 (TOURNIQUET CUFF) ×2
CUFF TRNQT CYL 34X4.125X (TOURNIQUET CUFF) ×1 IMPLANT
DRAPE C-ARM FOLDED MOBILE STRL (DRAPES) ×2 IMPLANT
DRAPE C-ARMOR (DRAPES) ×2 IMPLANT
DRAPE ORTHO 2.5IN SPLIT 77X108 (DRAPES) ×1 IMPLANT
DRAPE ORTHO SPLIT 77X108 STRL (DRAPES) ×2
ELECT REM PT RETURN 9FT ADLT (ELECTROSURGICAL) ×2
ELECTRODE REM PT RTRN 9FT ADLT (ELECTROSURGICAL) ×1 IMPLANT
FIXATION BB TAK SMALL (MISCELLANEOUS) IMPLANT
GAUZE SPONGE 4X4 12PLY STRL (GAUZE/BANDAGES/DRESSINGS) ×2 IMPLANT
GAUZE XEROFORM 1X8 LF (GAUZE/BANDAGES/DRESSINGS) ×2 IMPLANT
GLOVE SRG 8 PF TXTR STRL LF DI (GLOVE) ×1 IMPLANT
GLOVE SURG POLYISO LF SZ8 (GLOVE) ×4 IMPLANT
GLOVE SURG UNDER POLY LF SZ7 (GLOVE) ×6 IMPLANT
GLOVE SURG UNDER POLY LF SZ8 (GLOVE) ×2
GOWN STRL REUS W/ TWL XL LVL3 (GOWN DISPOSABLE) ×1 IMPLANT
GOWN STRL REUS W/TWL LRG LVL3 (GOWN DISPOSABLE) ×4 IMPLANT
GOWN STRL REUS W/TWL XL LVL3 (GOWN DISPOSABLE) ×2
INST SET MINOR BONE (KITS) ×2 IMPLANT
K-WIRE BB-TAK (WIRE) ×4
KIT TURNOVER KIT A (KITS) ×2 IMPLANT
KWIRE BB-TAK (WIRE) IMPLANT
MANIFOLD NEPTUNE II (INSTRUMENTS) ×2 IMPLANT
NDL HYPO 21X1.5 SAFETY (NEEDLE) IMPLANT
NEEDLE HYPO 21X1.5 SAFETY (NEEDLE) ×2 IMPLANT
NS IRRIG 1000ML POUR BTL (IV SOLUTION) ×3 IMPLANT
PACK BASIC LIMB (CUSTOM PROCEDURE TRAY) ×2 IMPLANT
PAD ABD 5X9 TENDERSORB (GAUZE/BANDAGES/DRESSINGS) ×8 IMPLANT
PAD ARMBOARD 7.5X6 YLW CONV (MISCELLANEOUS) ×2 IMPLANT
PAD CAST 4YDX4 CTTN HI CHSV (CAST SUPPLIES) ×1 IMPLANT
PADDING CAST ABS 4INX4YD NS (CAST SUPPLIES) ×2
PADDING CAST ABS COTTON 4X4 ST (CAST SUPPLIES) ×2 IMPLANT
PADDING CAST COTTON 4X4 STRL (CAST SUPPLIES) ×2
PENCIL SMOKE EVACUATOR (MISCELLANEOUS) ×2 IMPLANT
PLATE BONE 2.4 8 HOLE (Plate) ×1 IMPLANT
PLATE POSTERIOR FIBULA (Plate) ×2 IMPLANT
SCREW CORT 26X2.4XLOPRFL NS LF (Screw) IMPLANT
SCREW CORT LP 2.4X26 (Screw) ×2 IMPLANT
SCREW CORT LP 2.4X28 (Screw) ×1 IMPLANT
SCREW CORT LP 2.4X30 (Screw) ×2 IMPLANT
SCREW CORT LP 2.4X30 CORTICAL (Screw) ×1 IMPLANT
SCREW CORT LP 2.4X32 (Screw) ×1 IMPLANT
SCREW CORT LP 2.4X38 (Screw) ×1 IMPLANT
SCREW CORT TI FT ANKLE 3.5X20 (Screw) ×2 IMPLANT
SCREW CORTICAL 3MMX16MM (Screw) ×1 IMPLANT
SCREW CORTICAL 3MMX18MM (Screw) ×1 IMPLANT
SCREW LP CORT 3.0X26MM (Screw) ×1 IMPLANT
SCREW LP TITANIUM 3.5X24MM (Screw) ×1 IMPLANT
SCREW QCFIX CANN 4.0X44 (Screw) ×2 IMPLANT
SCREW QCKFIX CANN 4.0X40MM (Screw) ×1 IMPLANT
SET BASIN LINEN APH (SET/KITS/TRAYS/PACK) ×2 IMPLANT
SPLINT PLASTER CAST XFAST 5X30 (CAST SUPPLIES) IMPLANT
SPLINT PLASTER XFAST SET 5X30 (CAST SUPPLIES) ×1
SPONGE T-LAP 18X18 ~~LOC~~+RFID (SPONGE) ×4 IMPLANT
SUT ETHILON 3 0 FSL (SUTURE) ×3 IMPLANT
SUT MON AB 2-0 CT1 36 (SUTURE) ×1 IMPLANT
SUT VIC AB 2-0 CT1 27 (SUTURE) ×2
SUT VIC AB 2-0 CT1 TAPERPNT 27 (SUTURE) ×1 IMPLANT
SYR 30ML LL (SYRINGE) ×2 IMPLANT
SYR BULB IRRIG 60ML STRL (SYRINGE) ×4 IMPLANT

## 2021-05-10 NOTE — Op Note (Addendum)
Orthopaedic Surgery Operative Note (CSN: [id number])  Carol Todd  Oct 24, 1960 Date of Surgery: 05/10/2021   Diagnoses:  Closed right trimalleolar ankle fracture dislocation  Procedure: Operative fixation of the right posterior malleolus, right distal fibula and right medial malleolus fractures   Operative Finding Successful completion of the planned procedure.  Operative fixation with antiglide plate for posterior malleolus and distal fibula fracture.   Open reduction and fixation of medial malleolus with 2 x partially cannulated screws.    Post-Op Diagnosis: Same Surgeons:Primary: Oliver Barre, MD Assistants: Gearldine Shown Location: [facility] OR ROOM 4 Anesthesia: General with regional anesthesia Antibiotics: Ancef 2 g Tourniquet time:  Total Tourniquet Time Documented: Thigh (Right) - 121 minutes Thigh (Right) - 17 minutes Total: Thigh (Right) - 138 minutes  Estimated Blood Loss: 100 cc Complications: None Specimens: None Implants: Implant Name Type Inv. Item Serial No. Manufacturer Lot No. LRB No. Used Action  PLATE BONE 2.4 8 HOLE - [id number] Plate PLATE BONE 2.4 8 HOLE  [employer] STERILE ON SET Right 1 Implanted  SCREW CORT LP 2.4X26 - [id number] Screw SCREW CORT LP 2.4X26  [employer] STERILE ON SET Right 1 Implanted  SCREW CORT LP 2.4X32 - [id number] Screw SCREW CORT LP 2.4X32  [employer] STERILE ON SET Right 1 Implanted  SCREW CORT LP 2.4X38 - [id number] Screw SCREW CORT LP 2.4X38  [employer] STERILE ON SET Right 1 Implanted  PLATE POSTERIOR FIBULA - [id number] Plate PLATE POSTERIOR FIBULA  [employer] STERILE ON SET Right 1 Implanted  SCREW CORTICAL 3MMX18MM - [id number] Screw SCREW CORTICAL 3MMX18MM  [employer] STERILE ON SET Right 1 Implanted  SCREW CORT TI FT ANKLE 3.5X20 - [id number] Screw SCREW CORT TI FT ANKLE 3.5X20  [employer] STERILE ON SET Right 1 Implanted  SCREW CORTICAL 3MMX16MM - [id number] Screw SCREW CORTICAL 3MMX16MM  [employer] STERILE ON SET Right 1  Implanted  SCREW QCKFIX CANN 4.0X40MM - [id number] Screw SCREW QCKFIX CANN 4.0X40MM  [employer] STERILE ON SET Right 1 Implanted  SCREW QCFIX CANN 4.0X44 - [id number] Screw SCREW QCFIX CANN 4.0X44  [employer] STERILE ON SET Right 1 Implanted    Indications for Surgery:   Carol Todd is a 60 y.o. female who sustained a right ankle fracture dislocation.  She was evaluated in the emergency department, and her ankle was reduced.  She presented to clinic for further evaluation, including discussion for surgery. Benefits and risks of operative and nonoperative management were discussed prior to surgery with the patient and informed consent form was completed.  Specific risks including infection, need for additional surgery, persistent pain, malunion, nonunion, poor healing, bleeding and more severe complications associated with anesthesia.  She elected to proceed.   Procedure:   The patient was identified properly. Informed consent was obtained and the surgical site was marked. The patient was taken to the OR where general anesthesia was induced.  The patient was positioned lateral, on a beanbag with the right leg facing upward..  The right leg was prepped and draped in the usual sterile fashion.  Timeout was performed before the beginning of the case.  Tourniquet was used for the above duration.  She received 2 g of Ancef prior to making incision.  We will planned to begin with the posterior malleolus fragment.  The posterior edge of the fibula was identified, as well as the lateral aspect of the Achilles tendon.  We made an incision just through the dermis, in between these 2 landmarks.  We then  dissected bluntly through this interval.  The sural nerve was not visualized, we took great care dissecting through the subcutaneous tissue to avoid damage to the sural nerve.  The fascia overlying the peroneal tendons was identified, and incised sharply.  This was extended proximally and distally with  Metzenbaum scissors.  The peroneal tendons and musculature were then retracted laterally.  The fascia overlying the FHL was then identified.  This was also incised sharply.  This incision was extended proximally distally to expose the FHL muscle belly.  This was dissected bluntly from the posterior aspect of the distal tibia.  Using fluoroscopy, we were then able to identify the posterior malleolus fracture fragment.  At this point, the fracture was partially healed.  We were able to free up the fracture fragment using a Freer, and curved osteotome.  The fracture fragment was completely freed up, and this confirmed on fluoroscopy.  We then attempted to reduce the posterior malleolus fragment, but it was difficult to fully reduce this fracture fragment.  We then turned our attention to the distal fibula fracture.  The peroneal tendons were retracted medially, to expose the posterior aspect of the distal fibula.  The fracture site was identified.  Substantial callus formation at the fracture site was identified.  This was sequentially removed using a rondure, key elevator and irrigation.  Once the distal fibula fracture was completely freed up, we turned our attention back to the posterior malleolus fragment.  A 2.4 mm T-shaped plate was selected from the back table, and provisionally placed on the posterior malleolus.  The plate was cut to provide sufficient length.  We were satisfied with the positioning of the plate.  Once again, we attempted reduction of the posterior malleolus fragment, but we are unable to fully reduce the fracture fragment.  There was nothing blocking the complete reduction of this fragment.  We confirmed that the patient was paralyzed.  Using a large fracture reduction clamp, we attempted to further reduce the fragment, but this did not improve the alignment. Despite multiple attempts, we decide to accept a small amount of articular step-off, which measured less than 2 mm.  The plate was  then secured over the posterior malleolus fragment.  A single screw, just proximal to the most proximal extent of the fracture was secured into place in an antiglide fashion.  The initial screw achieved excellent bicortical purchase.  The plate was appropriately contoured to the distal tibia.  Once again, the fracture fragment was then completely reduced, with an acceptable intra-articular step-off.  We then proceeded to place 2 additional screws to secure the plate to the distal aspect of the tibia.  We turned our attention back to the distal fibula fracture.  The peroneal tendons are once again retracted medially.  The fracture site was identified.  An appropriately sized posterior lateral, distal fibula plate was selected, and provisionally secured to the fibula.  We placed a single screw, just proximal to the fracture site.  This completely reduced the distal fibula fracture.  We achieved excellent purchase.  We then proceeded to place multiple screws in the proximal and distal aspect of the plate.  Orthogonal views on fluoroscopy confirmed excellent reduction of the distal fibular fracture.  At this point, we let the tourniquet down to confirm that there was no articular bleeding.  There was some oozing from the muscle, but this improved with pressure.  The posterior based incision was then closed with multiple horizontal mattress sutures, using 3-0 nylon.  We  then turned our attention to the medial malleolus fracture.  The beanbag was let down, the patient was positioned supine on the OR table.  The medial malleolus fracture fragment was slightly distracted, but was otherwise minimally displaced.  We briefly attempted percutaneous fixation.  However, it was clear that we were not going to be able to get an adequate reduction.  We then made a curvilinear incision over the distal aspect of the medial malleolus.  We dissected bluntly through subcutaneous tissue, protecting the saphenous vein and nerve.  The  structures were not visualized through our incision.  With the assistance of fluoroscopy, the fracture site was identified.  This was opened sharply, and then the distal fragment was freed up using a Therapist, nutritional.  Callus was then removed from the fracture site using a rongeur and irrigation.  We then used a point-to-point clamp, to reduce the medial malleolus fracture fragment.  This was confirmed under fluoroscopy.  A K wire was then placed across the fracture site, and fluoroscopy confirmed its positioning as well as the reduction.  A second K wire was then placed, parallel to the first K wire.  The depth of the screws was then measured, and the outer cortex was drilled.  We then proceeded to place 2, partially threaded cannulated screws to secure our fixation.  Positioning of the screws, as well as the reduction of the fracture fragment were confirmed using fluoroscopy.  The medial incision was then irrigated, and closed with multiple horizontal mattress sutures using 3-0 nylon.  Final views under fluoroscopy confirmed excellent reduction, with a stable mortise and syndesmosis.  Sterile dressing was placed, followed by a well-padded splint.  Patient was awoken taken to PACU in stable condition.   Post-operative plan:  The patient will be discharged home, when she has recovered in the PACU. He will be nonweightbearing on the right lower extremity. DVT prophylaxis Aspirin 81 mg twice daily for 6 weeks.    Pain control with PRN pain medication preferring oral medicines.   Follow up plan will be scheduled in approximately 10-14 days for incision check and XR.

## 2021-05-10 NOTE — Brief Op Note (Signed)
05/10/2021  11:10 AM  PATIENT:  Cephus Richer  60 y.o. female  PRE-OPERATIVE DIAGNOSIS:  Closed right trimalleolar ankle fracture  POST-OPERATIVE DIAGNOSIS:  Closed right trimalleolar ankle fracture  PROCEDURE:  Procedure(s) with comments: OPEN REDUCTION INTERNAL FIXATION (ORIF) ANKLE FRACTURE (Right) - Trimalleolar ankle fracture  SURGEON:  Surgeon(s) and Role:    Oliver Barre, MD - Primary  PHYSICIAN ASSISTANT:   ASSISTANTS: none   ANESTHESIA:   local, regional, and general  EBL:  50 cc  BLOOD ADMINISTERED:none  DRAINS: none   LOCAL MEDICATIONS USED:  MARCAINE     SPECIMEN:  No Specimen  DISPOSITION OF SPECIMEN:  N/A  COUNTS:  YES  TOURNIQUET:   Total Tourniquet Time Documented: Thigh (Right) - 121 minutes Thigh (Right) - 17 minutes Total: Thigh (Right) - 138 minutes   DICTATION: .Note written in EPIC  PLAN OF CARE: Discharge to home after PACU  PATIENT DISPOSITION:  PACU - hemodynamically stable.   Delay start of Pharmacological VTE agent (>24hrs) due to surgical blood loss or risk of bleeding: yes

## 2021-05-10 NOTE — Anesthesia Procedure Notes (Addendum)
Anesthesia Regional Block: Adductor canal block   Pre-Anesthetic Checklist: , timeout performed,  Correct Patient, Correct Site, Correct Laterality,  Correct Procedure, Correct Position, site marked,  Risks and benefits discussed,  Surgical consent,  Pre-op evaluation,  At surgeon's request and post-op pain management  Laterality: Right  Prep: chloraprep       Needles:  Injection technique: Single-shot  Needle Type: Stimulator Needle - 80     Needle Length: 10cm  Needle Gauge: 20   Needle insertion depth: 6 cm   Additional Needles:   Procedures:,,,, ultrasound used (permanent image in chart),,    Narrative:  Start time: 05/10/2021 7:21 AM End time: 05/10/2021 7:27 AM Injection made incrementally with aspirations every 5 mL.  Performed by: Personally  Anesthesiologist: Molli Barrows, MD  Additional Notes: BP cuff, EKG monitors applied. Sedation begun. After nerve location anesthetic injected incrementally, slowly , and after neg aspirations. 15 ml of 0.25% bupivacaine with epi and dexamethasone 4 mg was injected at saphenous/adductor canal block. Tolerated well.

## 2021-05-10 NOTE — Anesthesia Preprocedure Evaluation (Signed)
Anesthesia Evaluation  Patient identified by MRN, date of birth, ID band Patient awake    Reviewed: Allergy & Precautions, NPO status , Patient's Chart, lab work & pertinent test results  History of Anesthesia Complications Negative for: history of anesthetic complications  Airway Mallampati: II  TM Distance: >3 FB Neck ROM: Full    Dental  (+) Dental Advisory Given, Missing   Pulmonary shortness of breath and with exertion, Current Smoker and Patient abstained from smoking.,    Pulmonary exam normal breath sounds clear to auscultation       Cardiovascular hypertension, Pt. on medications + dysrhythmias (h/o irregular heart beats)  Rhythm:Regular Rate:Normal + Systolic murmurs    Neuro/Psych PSYCHIATRIC DISORDERS Anxiety Depression    GI/Hepatic Neg liver ROS, GERD (mild)  Controlled,  Endo/Other  negative endocrine ROS  Renal/GU negative Renal ROS     Musculoskeletal negative musculoskeletal ROS (+) Right ankle fracture    Abdominal   Peds  Hematology negative hematology ROS (+)   Anesthesia Other Findings   Reproductive/Obstetrics negative OB ROS                            Anesthesia Physical Anesthesia Plan  ASA: 2  Anesthesia Plan: General   Post-op Pain Management:  Regional for Post-op pain   Induction: Intravenous  PONV Risk Score and Plan: 3 and Ondansetron, Dexamethasone and Midazolam  Airway Management Planned: LMA  Additional Equipment:   Intra-op Plan:   Post-operative Plan: Extubation in OR  Informed Consent: I have reviewed the patients History and Physical, chart, labs and discussed the procedure including the risks, benefits and alternatives for the proposed anesthesia with the patient or authorized representative who has indicated his/her understanding and acceptance.     Dental advisory given  Plan Discussed with: CRNA and Surgeon  Anesthesia Plan  Comments:         Anesthesia Quick Evaluation

## 2021-05-10 NOTE — Transfer of Care (Signed)
Immediate Anesthesia Transfer of Care Note  Patient: Cephus Richer  Procedure(s) Performed: OPEN REDUCTION INTERNAL FIXATION (ORIF) ANKLE FRACTURE (Right: Ankle)  Patient Location: PACU  Anesthesia Type:General and Regional  Level of Consciousness: awake, alert  and oriented  Airway & Oxygen Therapy: Patient Spontanous Breathing and Patient connected to face mask oxygen  Post-op Assessment: Report given to RN and Post -op Vital signs reviewed and stable  Post vital signs: Reviewed and stable  Last Vitals:  Vitals Value Taken Time  BP 172/75 05/10/21 1057  Temp    Pulse 105 05/10/21 1059  Resp    SpO2 100 % 05/10/21 1059  Vitals shown include unvalidated device data.  Last Pain:  Vitals:   05/10/21 0648  TempSrc: Oral  PainSc: 0-No pain      Patients Stated Pain Goal: 6 (05/10/21 5852)  Complications: No notable events documented.

## 2021-05-10 NOTE — Anesthesia Procedure Notes (Signed)
Procedure Name: Intubation Date/Time: 05/10/2021 7:39 AM Performed by: Julian Reil, CRNA Pre-anesthesia Checklist: Patient identified, Emergency Drugs available, Suction available and Patient being monitored Patient Re-evaluated:Patient Re-evaluated prior to induction Oxygen Delivery Method: Circle system utilized Preoxygenation: Pre-oxygenation with 100% oxygen Induction Type: IV induction Ventilation: Mask ventilation without difficulty Laryngoscope Size: Miller and 3 Grade View: Grade II Tube type: Oral Tube size: 7.0 mm Number of attempts: 1 Airway Equipment and Method: Stylet Placement Confirmation: ETT inserted through vocal cords under direct vision, positive ETCO2 and breath sounds checked- equal and bilateral Secured at: 23 cm Tube secured with: Tape Dental Injury: Teeth and Oropharynx as per pre-operative assessment  Comments: 4x4s bite block used at end of case.

## 2021-05-10 NOTE — Anesthesia Postprocedure Evaluation (Signed)
Anesthesia Post Note  Patient: Carol Todd  Procedure(s) Performed: OPEN REDUCTION INTERNAL FIXATION (ORIF) ANKLE FRACTURE (Right: Ankle)  Patient location during evaluation: PACU Anesthesia Type: Regional Level of consciousness: awake and alert and oriented Pain management: pain level controlled Vital Signs Assessment: post-procedure vital signs reviewed and stable Respiratory status: spontaneous breathing and respiratory function stable Cardiovascular status: blood pressure returned to baseline and stable Postop Assessment: no apparent nausea or vomiting Anesthetic complications: no   No notable events documented.   Last Vitals:  Vitals:   05/10/21 1130 05/10/21 1149  BP: (!) 152/72 (!) 160/78  Pulse: (!) 106 96  Resp: (!) 22 18  Temp: 36.7 C 36.7 C  SpO2: 93% 98%    Last Pain:  Vitals:   05/10/21 1149  TempSrc: Oral  PainSc: 2                  Elvert Cumpton C Taiesha Bovard

## 2021-05-10 NOTE — Interval H&P Note (Signed)
History and Physical Interval Note:  05/10/2021 7:12 AM  Carol Todd  has presented today for surgery, with the diagnosis of Closed right trimalleolar ankle fracture.  The various methods of treatment have been discussed with the patient and family. After consideration of risks, benefits and other options for treatment, the patient has consented to  Procedure(s) with comments: OPEN REDUCTION INTERNAL FIXATION (ORIF) ANKLE FRACTURE (Right) - Trimalleolar ankle fracture as a surgical intervention.  The patient's history has been reviewed, patient examined, no change in status, stable for surgery.  I have reviewed the patient's chart and labs.  Questions were answered to the patient's satisfaction.     Oliver Barre

## 2021-05-10 NOTE — Anesthesia Procedure Notes (Addendum)
Anesthesia Regional Block: Popliteal block   Pre-Anesthetic Checklist: , timeout performed,  Correct Patient, Correct Site, Correct Laterality,  Correct Procedure, Correct Position, site marked,  Risks and benefits discussed,  Surgical consent,  Pre-op evaluation,  At surgeon's request and post-op pain management  Laterality: Lower and Right  Prep: chloraprep       Needles:  Injection technique: Single-shot  Needle Type: Echogenic Stimulator Needle     Needle Length: 10cm  Needle Gauge: 20   Needle insertion depth: 6 cm   Additional Needles:   Procedures:,,,, ultrasound used (permanent image in chart),,    Narrative:  Start time: 05/10/2021 7:15 AM End time: 05/10/2021 7:20 AM Injection made incrementally with aspirations every 5 mL.  Performed by: Personally  Anesthesiologist: Molli Barrows, MD  Additional Notes: 15 ml of Bupivacaine 0.25% with epi plus 14 ml of bupivacaine 0.5% plus dexamethasone 4 mg, total of 30 ml was injected at right popliteal block. Block assessed prior to start of surgery

## 2021-05-10 NOTE — Discharge Instructions (Signed)
Joselin Crandell A. Syerra Abdelrahman, MD MS [facility] [address] [place],  [place]  [address] Phone: [phone] Fax: [phone]   POST-OPERATIVE INSTRUCTIONS - LOWER EXTREMITY   WOUND CARE Please keep splint clean dry and intact until followup.  You may shower on Post-Op Day #2.  You must keep splint dry during this process and may find that a plastic bag taped around the leg or alternatively a towel based bath may be a better option.   If you get your splint wet or if it is damaged please contact our clinic.  EXERCISES Due to your splint being in place you will not be able to bear weight through your extremity.   DO NOT PUT ANY WEIGHT ON YOUR OPERATIVE LEG Please use crutches or a walker to avoid weight bearing.   REGIONAL ANESTHESIA (NERVE BLOCKS) The anesthesia team may have performed a nerve block for you if safe in the setting of your care.  This is a great tool used to minimize pain.  Typically the block may start wearing off overnight but the long acting medicine may last for 3-4 days.  The nerve block wearing off can be a challenging period but please utilize your as needed pain medications to try and manage this period.    POST-OP MEDICATIONS- Multimodal approach to pain control  In general your pain will be controlled with a combination of substances.  Prescriptions unless otherwise discussed are electronically sent to your pharmacy.  This is a carefully made plan we use to minimize narcotic use.      - Celebrex - Anti-inflammatory medication taken on a scheduled basis  - Acetaminophen - Non-narcotic pain medicine taken on a scheduled basis   - Oxycodone - This is a strong narcotic, to be used only on an "as needed" basis for pain.  -  Aspirin 81mg - This medicine is used to minimize the risk of blood clots after surgery.             -          Zofran - take as needed for nausea   FOLLOW-UP If you develop a Fever (>101.5), Redness or Drainage from the  surgical incision site, please call our office to arrange for an evaluation. Please call the office to schedule a follow-up appointment for your incision check if you do not already have one, 10-14 days post-operatively.  IF YOU HAVE ANY QUESTIONS, PLEASE FEEL FREE TO CALL OUR OFFICE.  HELPFUL INFORMATION  If you had a block, it will wear off between 8-24 hrs postop typically.  This is period when your pain may go from nearly zero to the pain you would have had postop without the block.  This is an abrupt transition but nothing dangerous is happening.  You may take an extra dose of narcotic when this happens.  You should wean off your narcotic medicines as soon as you are able.  Most patients will be off or using minimal narcotics before their first postop appointment.   Elevating your leg will help with swelling and pain control.  You are encouraged to elevate your leg as much as possible in the first couple of weeks following surgery.  Imagine a drop of water on your toe, and your goal is to get that water back to your heart.  We suggest you use the pain medication the first night prior to going to bed, in order to ease any pain when the anesthesia wears off. You   should avoid taking pain medications on an empty stomach as it will make you nauseous.  Do not drink alcoholic beverages or take illicit drugs when taking pain medications.  In most states it is against the law to drive while you are in a splint or sling.  And certainly against the law to drive while taking narcotics.  You may return to work/school in the next couple of days when you feel up to it.   Pain medication may make you constipated.  Below are a few solutions to try in this order: Decrease the amount of pain medication if you aren't having pain. Drink lots of decaffeinated fluids. Drink prune juice and/or each dried prunes  If the first 3 don't work start with additional solutions Take Colace - an over-the-counter stool  softener Take Senokot - an over-the-counter laxative Take Miralax - a stronger over-the-counter laxative  

## 2021-05-11 ENCOUNTER — Encounter (HOSPITAL_COMMUNITY): Payer: Self-pay | Admitting: Orthopedic Surgery

## 2021-05-21 ENCOUNTER — Ambulatory Visit

## 2021-05-21 ENCOUNTER — Ambulatory Visit (INDEPENDENT_AMBULATORY_CARE_PROVIDER_SITE_OTHER): Admitting: Orthopedic Surgery

## 2021-05-21 ENCOUNTER — Encounter: Payer: Self-pay | Admitting: Orthopedic Surgery

## 2021-05-21 ENCOUNTER — Other Ambulatory Visit: Payer: Self-pay

## 2021-05-21 VITALS — Ht 66.0 in | Wt 199.0 lb

## 2021-05-21 DIAGNOSIS — S82851A Displaced trimalleolar fracture of right lower leg, initial encounter for closed fracture: Secondary | ICD-10-CM | POA: Diagnosis not present

## 2021-05-21 NOTE — Patient Instructions (Signed)

## 2021-05-21 NOTE — Progress Notes (Signed)
Orthopaedic Postop Note  Assessment: Carol Todd is a 60 y.o. female s/p ORIF of Right ankle fracture  DOS: 05/10/21  Plan: Sutures removed, steri strips placed Well padded short leg cast placed in clinic today NWB on the operative extremity Continue to take Aspirin 81 mg BID Radiographs reviewed, all questions were answered. She will continue with medications as needed.  No further prescriptions required.    Follow-up: No follow-ups on file. XR at next visit: Right ankle  Subjective:  Chief Complaint  Patient presents with   Post-op Follow-up    05/10/21 Right ankle fracture     History of Present Illness: Carol Todd is a 60 y.o. female who presents following the above stated procedure.  She has done well following surgery.  She is no longer taking narcotics.  She is taking Tylenol as needed.  She notes the block did very well following surgery, but in the following days after surgery, she noted some nerve type pain.  He has remained nonweightbearing.  She does continue to smoke, but states she is trying to cut back.  She tolerated the splint well.  Review of Systems: No fevers or chills No numbness or tingling No Chest Pain No shortness of breath   Objective: Ht 5\' 6"  (1.676 m)   Wt 199 lb (90.3 kg)   BMI 32.12 kg/m   Physical Exam:  Alert and oriented.  No acute distress  Surgical site incisions are healing well, without surrounding erythema or drainage.  She has intact sensation in the sural nerve, superficial peroneal, deep peroneal and saphenous nerve distribution.  Toes are warm and well-perfused.  2+ DP pulse.  Active motion intact in the EHL and TA.  IMAGING: I personally ordered and reviewed the following images  X-rays of the right ankle were obtained in clinic today and demonstrates excellent alignment of the right trimalleolar ankle fracture.  There is been no evidence of hardware failure or loosening.  Screws remain in good condition.  No  evidence of the screws backing out.  There is mild residual step-off in the posterior malleolus fragment, but this remains unchanged.  Impression: Healing right trimalleolar ankle fracture, status post operative fixation  , MD 05/21/2021 10:09 AM

## 2021-06-07 ENCOUNTER — Ambulatory Visit (INDEPENDENT_AMBULATORY_CARE_PROVIDER_SITE_OTHER): Admitting: Orthopedic Surgery

## 2021-06-07 ENCOUNTER — Ambulatory Visit

## 2021-06-07 ENCOUNTER — Other Ambulatory Visit: Payer: Self-pay

## 2021-06-07 ENCOUNTER — Encounter: Payer: Self-pay | Admitting: Orthopedic Surgery

## 2021-06-07 DIAGNOSIS — S82851D Displaced trimalleolar fracture of right lower leg, subsequent encounter for closed fracture with routine healing: Secondary | ICD-10-CM | POA: Diagnosis not present

## 2021-06-07 NOTE — Progress Notes (Signed)
Orthopaedic Postop Note  Assessment: Carol Todd is a 60 y.o. female s/p ORIF of Right ankle fracture  DOS: 05/10/21  Plan: Radiographs stable Pain controlled with Tylenol Pain worse at night, causes her to want to move her leg.  Aspirin 81 mg BID Continue NWB No other issues.    Cast application - right short leg cast   Verbal consent was obtained and the correct extremity was identified. A well padded, appropriately molded short leg cast was applied to the right leg Toes remained warm and well perfused.   There were no sharp edges Patient tolerated the procedure well Cast care instructions were provided      Follow-up: Return in about 2 weeks (around 06/21/2021). XR at next visit: Right ankle  Subjective:  Chief Complaint  Patient presents with   Routine Post Op    DOS 05/10/21    History of Present Illness: Carol Todd is a 59 y.o. female who presents following the above stated procedure.  Pain is controlled.  She is taking tylenol only.  Aspirin BID.  Occasional worsening pain at night time.  She feels like she has restless leg at night.  She has remained NWB on the RLE.  She feels as though her left knee is worsening.  She is having some medial left knee pain.  She is using a walker to assist with ambulation, but because she has knee pain, she is moving around less.  No fevers or chills.  No numbness.   Review of Systems: No fevers or chills No numbness or tingling No Chest Pain No shortness of breath   Objective: There were no vitals taken for this visit.  Physical Exam:  Alert and oriented.  No acute distress  She is seated in a wheelchair.  Removal of the dressings demonstrates well-healing surgical incisions, with some residual scabbing.  She has intact sensation in the saphenous, sural, superficial peroneal, deep peroneal and tibial nerve distribution.  2+ DP pulse.  Toes are warm and well-perfused.  She tolerates gentle active dorsiflexion  and plantar flexion of the ankle.  Some stiffness at the first MTP joint, but actively dorsiflexes improved flexion of the great toe.   IMAGING: I personally ordered and reviewed the following images  X-rays of the right ankle were obtained in clinic today and demonstrates no acute injuries.  Hardware remains in good position.  No evidence of hardware failure or subsidence.  The mortise remains intact.  No syndesmotic disruption.  No evidence of hardware failure or loosening.  Impression: Right trimalleolar ankle fracture in good alignment following operative fixation.  Oliver Barre, MD 06/07/2021 2:41 PM

## 2021-06-07 NOTE — Patient Instructions (Signed)

## 2021-06-21 ENCOUNTER — Encounter: Payer: Self-pay | Admitting: Orthopedic Surgery

## 2021-06-21 ENCOUNTER — Ambulatory Visit (INDEPENDENT_AMBULATORY_CARE_PROVIDER_SITE_OTHER): Admitting: Orthopedic Surgery

## 2021-06-21 ENCOUNTER — Other Ambulatory Visit: Payer: Self-pay

## 2021-06-21 ENCOUNTER — Ambulatory Visit

## 2021-06-21 DIAGNOSIS — S82851D Displaced trimalleolar fracture of right lower leg, subsequent encounter for closed fracture with routine healing: Secondary | ICD-10-CM

## 2021-06-21 NOTE — Progress Notes (Signed)
Orthopaedic Postop Note  Assessment: Carol Todd is a 60 y.o. female s/p ORIF of Right ankle fracture  DOS: 05/10/21  Plan: Radiographs stable Pain is controlled with Tylenol Transition to walking boot Another 1-2 weeks with limited WB Ok to advance to [facility] after additional brief immobilization Pain medications as needed.  Provided General ankle exercises to improve ROM If she is struggling at the next visit, we can consider referral to PT Follow up 4 weeks   Follow-up: Return in about 4 weeks (around 07/19/2021). XR at next visit: Right ankle  Subjective:  Chief Complaint  Patient presents with   FX CARE    Xrays/ooc    History of Present Illness: Carol Todd is a 60 y.o. female who presents following the above stated procedure.  Surgery was approximately 6 weeks ago.  She has done well.  Her pain is controlled.  She is tolerated the cast, although she is ready to be without immobilization.  No issues to report at this time.  She denies numbness or tingling.  No fevers or chills.  Review of Systems: No fevers or chills No numbness or tingling No Chest Pain No shortness of breath   Objective: There were no vitals taken for this visit.  Physical Exam:  Alert and oriented.  No acute distress  Still unable to ambulate.  Evaluation of the right ankle demonstrates no swelling.  Medial and posterior lateral surgical incisions are healing well.  No surrounding erythema or drainage.  There are some residual scabbing, and dry skin around the incisions.  No tenderness directly over the surgical incisions.  Easily gets to a plantigrade position, with limited dorsiflexion beyond this.  Sensation is intact in the saphenous nerve, sural nerve, SP and DP nerve distributions.  2+ DP pulse.  Active motion intact in the EHL and TA.  She tolerates axial loading of the right foot.   IMAGING: I personally ordered and reviewed the following images  X-rays of the right ankle  were obtained in clinic today, and compared to previous x-rays.  Hardware remains well-positioned.  The mortise is intact.  No syndesmotic disruption.  No evidence of hardware failure, loosening or migration.  There has been interval consolidation of the fractures.  Impression: Healing right trimalleolar ankle fracture following operative fixation.  Oliver Barre, MD 06/21/2021 2:51 PM

## 2021-06-21 NOTE — Patient Instructions (Signed)
Ankle Exercises Ask your health care provider which exercises are safe for you. Do exercises exactly as told by your health care provider and adjust them as directed. It is normal to feel mild stretching, pulling, tightness, or mild discomfort as you do these exercises. Stop right away if you feel sudden pain or your pain gets worse. Do not begin these exercises until told by your health care provider.  Stretching and range-of-motion exercises These exercises warm up your muscles and joints and improve the movement and flexibility of your ankle. These exercises may also help to relieve pain.  Dorsiflexion/plantar flexion  Sit with your R knee straight or bent. Do not rest your foot on anything. Flex your left ankle to tilt the top of your foot toward your shin. This is called dorsiflexion. Hold this position for 5 seconds. Point your toes downward to tilt the top of your foot away from your shin. This is called plantar flexion. Hold this position for 5 seconds. Repeat 10 times. Complete this exercise 2-3 times a day.  As tolerated  Ankle alphabet  Sit with your R foot supported at your lower leg. Do not rest your foot on anything. Make sure your foot has room to move freely. Think of your R foot as a paintbrush: Move your foot to trace each letter of the alphabet in the air. Keep your hip and knee still while you trace the letters. Trace every letter from A to Z. Make the letters as large as you can without causing or increasing any discomfort.  Repeat 2-3 times. Complete this exercise 2-3 times a day.   Strengthening exercises These exercises build strength and endurance in your ankle. Endurance is the ability to use your muscles for a long time, even after they get tired. Dorsiflexors These are muscles that lift your foot up. Secure a rubber exercise band or tube to an object, such as a table leg, that will stay still when the band is pulled. Secure the other end around your R  foot. Sit on the floor, facing the object with your R leg extended. The band or tube should be slightly tense when your foot is relaxed. Slowly flex your R ankle and toes to bring your foot toward your shin. Hold this position for 5 seconds. Slowly return your foot to the starting position, controlling the band as you do that. Repeat 10 times. Complete this exercise 2-3 times a day.  Plantar flexors These are muscles that push your foot down. Sit on the floor with your R leg extended. Loop a rubber exercise band or tube around the ball of your R foot. The ball of your foot is on the walking surface, right under your toes. The band or tube should be slightly tense when your foot is relaxed. Slowly point your toes downward, pushing them away from you. Hold this position for 5 seconds. Slowly release the tension in the band or tube, controlling smoothly until your foot is back in the starting position. Repeat 10 times. Complete this exercise 2-3 times a day.  Towel curls  Sit in a chair on a non-carpeted surface, and put your feet on the floor. Place a towel in front of your feet. Keeping your heel on the floor, put your R foot on the towel. Pull the towel toward you by grabbing the towel with your toes and curling them under. Keep your heel on the floor. Let your toes relax. Grab the towel again. Keep pulling the towel  until it is completely underneath your foot. Repeat 10 times. Complete this exercise 2-3 times a day.  Standing plantar flexion This is an exercise in which you use your toes to lift your body's weight while standing. Stand with your feet shoulder-width apart. Keep your weight spread evenly over the width of your feet while you rise up on your toes. Use a wall or table to steady yourself if needed, but try not to use it for support. If this exercise is too easy, try these options: Shift your weight toward your R leg until you feel challenged. If told by your health care  provider, lift your uninjured leg off the floor. Hold this position for 5 seconds. Repeat 10 times. Complete this exercise 2-3 times a day.  Tandem walking Stand with one foot directly in front of the other. Slowly raise your back foot up, lifting your heel before your toes, and place it directly in front of your other foot. Continue to walk in this heel-to-toe way. Have a countertop or wall nearby to use if needed to keep your balance, but try not to hold onto anything for support.  Repeat 10 times. Complete this exercise 2-3 times a day.   Document Revised: 06/09/2018 Document Reviewed: 06/11/2018 Elsevier Patient Education  2020 [employer].

## 2021-07-19 ENCOUNTER — Other Ambulatory Visit: Payer: Self-pay

## 2021-07-19 ENCOUNTER — Ambulatory Visit

## 2021-07-19 ENCOUNTER — Ambulatory Visit (INDEPENDENT_AMBULATORY_CARE_PROVIDER_SITE_OTHER): Admitting: Orthopedic Surgery

## 2021-07-19 ENCOUNTER — Encounter: Payer: Self-pay | Admitting: Orthopedic Surgery

## 2021-07-19 DIAGNOSIS — S82851D Displaced trimalleolar fracture of right lower leg, subsequent encounter for closed fracture with routine healing: Secondary | ICD-10-CM | POA: Diagnosis not present

## 2021-07-19 NOTE — Progress Notes (Signed)
Orthopaedic Postop Note  Assessment: Carol Todd is a 60 y.o. female s/p ORIF of Right ankle fracture  DOS: 05/10/21  Plan: She is doing well.  She denies pain.  Occasional tylenol, mostly at night.  She has been walking in a walking boot, using a walker.  OK to advance her weight bearing and transition to a regular shoe.  Continue with medications as needed.  Anticipate she will be a in a regular shoe at the next visit.  Continue home exercise program, focusing on ROM and strengthening.   Follow-up: Return in about 6 weeks (around 08/30/2021). XR at next visit: Right ankle  Subjective:  Chief Complaint  Patient presents with   Routine Post Op    Follow up w/ xray// displaced trimalleolar fracture of right lower leg    History of Present Illness: Carol Todd is a 60 y.o. female who presents following the above stated procedure.  Surgery was 10 weeks ago.  She is walking with a walker, wearing a CAM walker boot.  She reports she is putting about 75% of her weight on the right ankle.  She is using Tylenol at night.  No fevers or chills. No issues with her surgical incision.   Review of Systems: No fevers or chills No numbness or tingling No Chest Pain No shortness of breath   Objective: There were no vitals taken for this visit.  Physical Exam:  Alert and oriented.  No acute distress  She ambulates with assistance, in a boot.   Right ankle without swelling. Medial incision well healed.  No surrounding erythema or drainage.  Lateral incision is healing well.  No erythema.  There are some healing scabs at the distal aspect of the incision.  No tenderness to palpation over the incisions.  5 degrees of dorsiflexion of the ankle with the knee extended.  10 degrees with the knee bent.   Sensation intact throughout the right foot.  2+ DP pulse.    IMAGING: I personally ordered and reviewed the following images  X-rays of the right ankle were obtained in clinic today, and  compared to previous x-rays.  There has been no interval displacement.  Interval consolidation of the fractures.  Mortise remains intact.  No hardware migration.  No failure of the implants.   Impression: Healing right trimalleolar ankle fracture following operative fixation, no hardware failure or loosening.   Oliver Barre, MD 07/19/2021 1:52 PM

## 2021-08-27 ENCOUNTER — Ambulatory Visit

## 2021-08-27 ENCOUNTER — Encounter: Payer: Self-pay | Admitting: Orthopedic Surgery

## 2021-08-27 ENCOUNTER — Other Ambulatory Visit: Payer: Self-pay

## 2021-08-27 ENCOUNTER — Ambulatory Visit (INDEPENDENT_AMBULATORY_CARE_PROVIDER_SITE_OTHER): Admitting: Orthopedic Surgery

## 2021-08-27 DIAGNOSIS — S82851D Displaced trimalleolar fracture of right lower leg, subsequent encounter for closed fracture with routine healing: Secondary | ICD-10-CM

## 2021-08-27 NOTE — Progress Notes (Signed)
Dg ankle 

## 2021-08-27 NOTE — Progress Notes (Signed)
Orthopaedic Postop Note  Assessment: Carol Todd is a 60 y.o. female s/p ORIF of Right ankle fracture  DOS: 05/10/21  Plan: Pain is improving. Transition off of the cane. Focus on stretching of the heel cord.  Exercises were provided.  Also demonstrated exercises in clinic today. Goal is to be walking abnormally, without a cane at the next visit Medications as needed. Elevate the leg to assist with swelling. Follow-up in 3 months  Follow-up: Return in about 3 months (around 11/25/2021). XR at next visit: Right ankle  Subjective:  Chief Complaint  Patient presents with   Ankle Injury    DOI 04/21/21 RT    History of Present Illness: Carol Todd is a 60 y.o. female who presents following the above stated procedure.  Surgery was 4+ months ago.  Her pain continues to improve.  Occasional shooting, nerve type pains in the medial ankle.  These are very brief.  Her walking is improving, but she continues to use a cane.  She is not working with physical therapy, but trying to do some exercises on her own.  Review of Systems: No fevers or chills No numbness or tingling No Chest Pain No shortness of breath   Objective: There were no vitals taken for this visit.  Physical Exam:  Alert and oriented.  No acute distress  She is wearing a regular shoe.  Ambulating with the assistance of a cane.  Medial and lateral surgical incisions are healing well.  No surrounding erythema or drainage.  No tenderness to palpation in line with the incisions.  Positive Tinel's over the saphenous nerve medially.  She is able to get to a plantigrade position, without additional dorsiflexion, with knee extended as well as flexed.  Mild discomfort in the anterior ankle when trying to stretch the heel cord.  Sensations intact throughout the right foot.  Toes warm and well-perfused.  2+ DP pulse.  IMAGING: I personally ordered and reviewed the following images  X-rays of the right ankle were  obtained in clinic today, compared to previous x-rays.  There is been no interval displacement of the fractures.  Hardware remains intact, without evidence of failure or loosening.  The screws are not backing out.  The mortise remains intact, but the joint space appears to be narrowed.  No acute injuries are noted.  No syndesmotic disruption.  Impression: Healing right trimalleolar ankle fracture, without hardware failure or loosening.  Oliver Barre, MD 08/27/2021 12:09 PM

## 2021-08-30 ENCOUNTER — Ambulatory Visit: Admitting: Orthopedic Surgery

## 2021-10-26 IMAGING — RF DG ANKLE 2V *R*
1 series · 4 of 4 positions shown · non-contrast
Comparison: CT right ankle 04/21/2021.

CLINICAL DATA: ORIF right ankle fracture.

EXAM:
DG C-ARM 1-60 MIN; RIGHT ANKLE - 2 VIEW
FLUOROSCOPY TIME:  Fluoroscopy Time:  1 minutes 38 seconds
Radiation Exposure Index (if provided by the fluoroscopic device):
Number of Acquired Spot Images: 0

[Series 1: run · 4 of 4 slices shown]
[im 1/4]
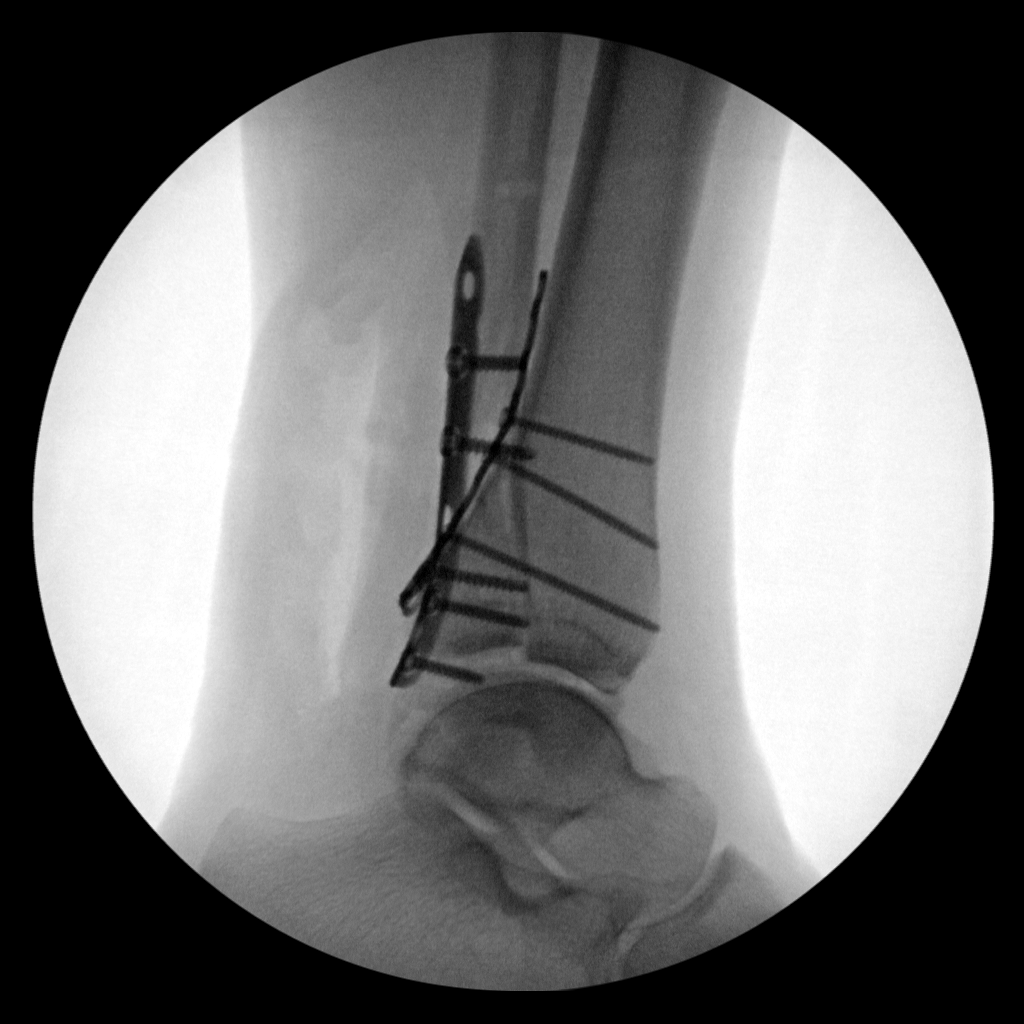
[im 2/4]
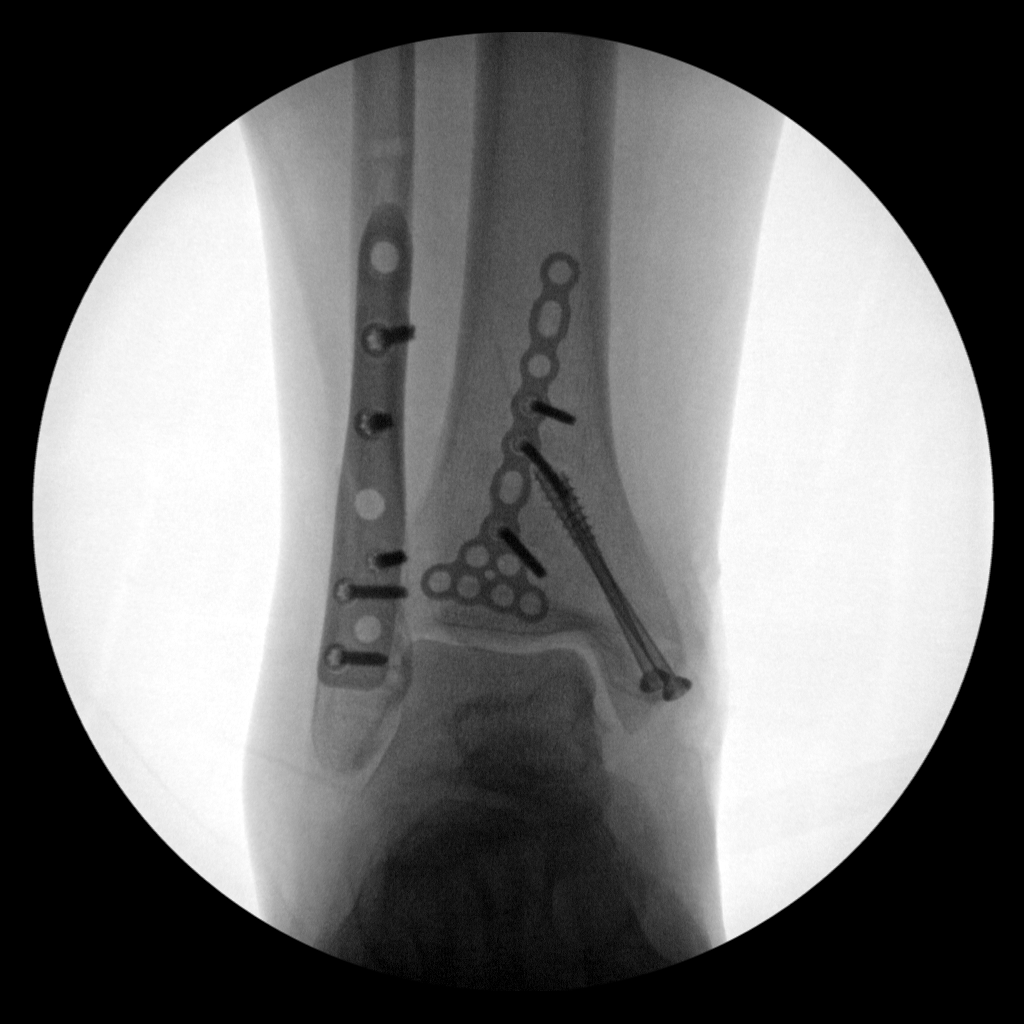
[im 3/4]
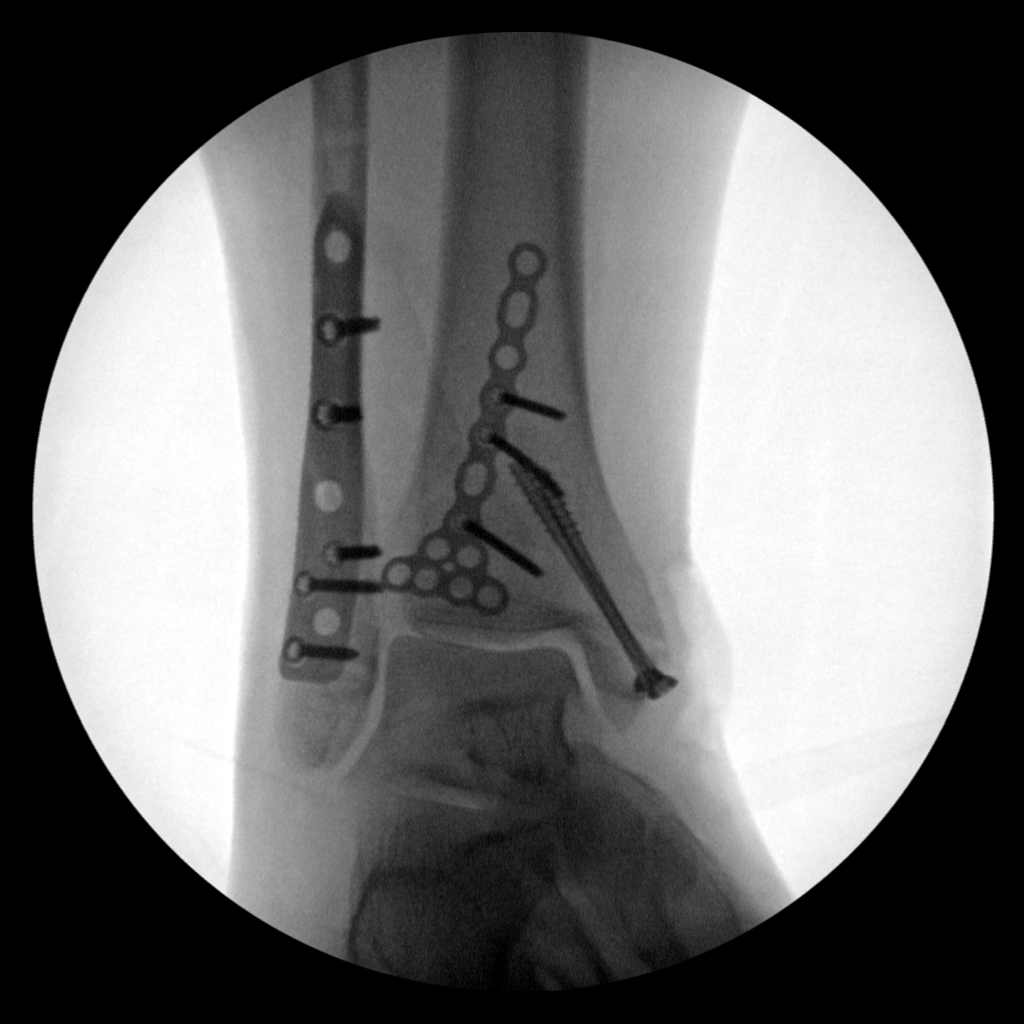
[im 4/4]
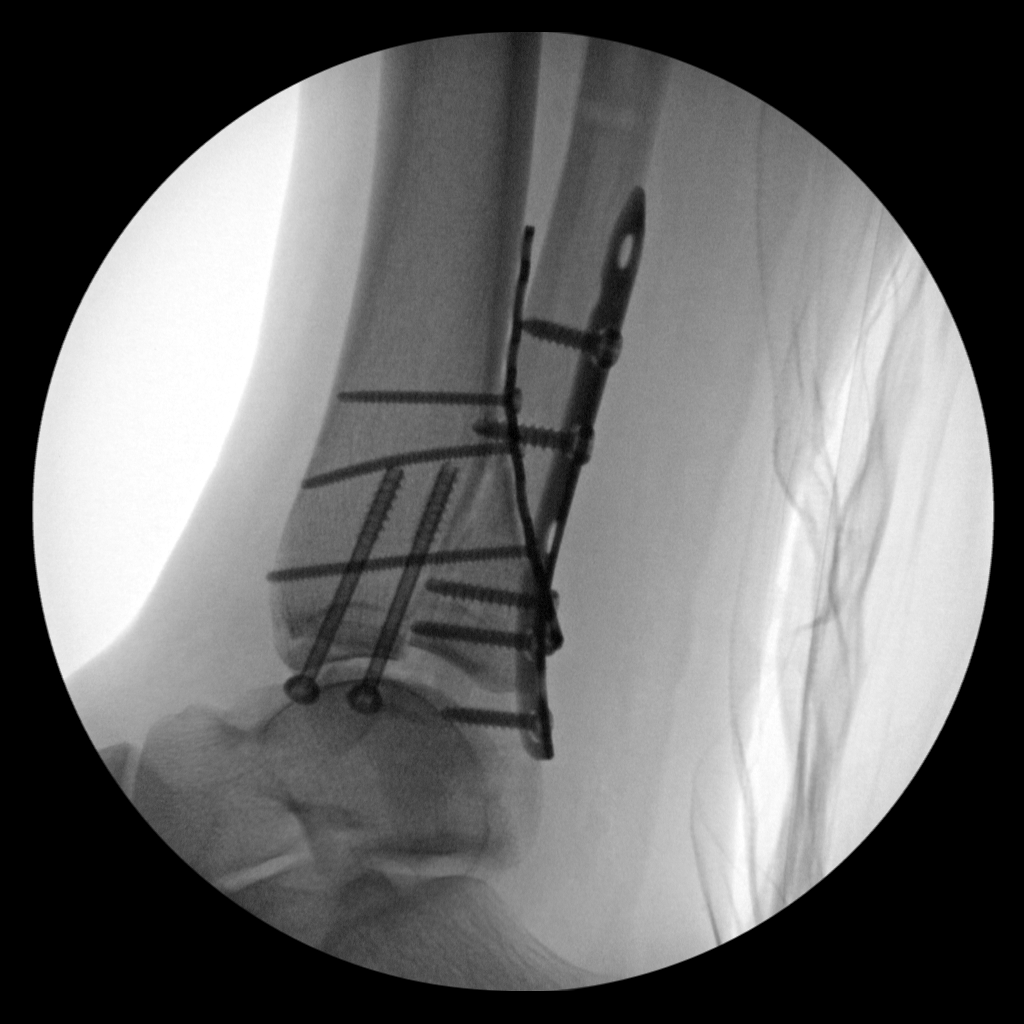

[4 of 4 positions shown; findings below may reference images not displayed]

FINDINGS: Four intraoperative fluoroscopic spot views of the right ankle are
provided in AP, oblique and lateral projections. Interval plate and
screw fixations of posterior and lateral malleolar fractures as well
as 2 screws across a medial malleolar fracture. Ankle mortise is
intact. There is slight offset of the distal tibial articular
surface on the lateral view.
IMPRESSION: Interval ORIF of a trimalleolar ankle fracture dislocation.

## 2021-11-26 ENCOUNTER — Other Ambulatory Visit: Payer: Self-pay

## 2021-11-26 ENCOUNTER — Encounter: Payer: Self-pay | Admitting: Orthopedic Surgery

## 2021-11-26 ENCOUNTER — Ambulatory Visit

## 2021-11-26 ENCOUNTER — Ambulatory Visit (INDEPENDENT_AMBULATORY_CARE_PROVIDER_SITE_OTHER): Admitting: Orthopedic Surgery

## 2021-11-26 DIAGNOSIS — S82851D Displaced trimalleolar fracture of right lower leg, subsequent encounter for closed fracture with routine healing: Secondary | ICD-10-CM

## 2021-11-26 NOTE — Progress Notes (Signed)
Orthopaedic Postop Note ? ?Assessment: ?Carol Todd is a 61 y.o. female s/p ORIF of Right ankle fracture ? ?DOS: 05/10/21 ? ?Plan: ?No pain.  In a regular shoe.  Occasional pains around the ankle.  Pain can be worse with changes in the weather.  Radiographs are stable.  Continue with activities as tolerated.  Follow up around the 1 year anniversary of surgery.   ? ? ? ?Follow-up in 6 months ? ?Follow-up: ?Return in about 6 months (around 05/29/2022) for 5-6 months. ?XR at next visit: Right ankle ? ?Subjective: ? ?Chief Complaint  ?Patient presents with  ? fracture care  ?  Displaced trimalleolar fracture of right lower leg ?DOI 04/21/21  ? ? ?History of Present Illness: ?Carol Todd is a 61 y.o. female who presents following the above stated procedure.  Surgery was 6-7 months ago.  She denies pain on a regular basis.  Occasional pain based on the weather.  She is not taking any medications.  She is wearing a regular shoe.  No issues with her incisions.  She is pleased with her progress.  She is able to complete all of her usual activities.  ? ? ?Review of Systems: ?No fevers or chills ?No numbness or tingling ?No Chest Pain ?No shortness of breath ? ? ?Objective: ?There were no vitals taken for this visit. ? ?Physical Exam: ? ?Alert and oriented.  No acute distress ? ?Normal gait, in regular shoes.  ? ?Surgical incisions are healing well.  No tenderness.  No surrounding erythema or drainage.  Small scab laterally.  10 dorsiflexion with her knee extended, 15 degrees with the knee flexed.   Toes are warm and well perfused.  Mild tenderness at the 1st MTP.   Sensation intact over the dorsum of her foot.  ? ?IMAGING: ?I personally ordered and reviewed the following images ? ?Radiographs of the right ankle obtained in clinic today.  No change in the overall Alignment.  Loss of joint space within the tibiotalar joint.  Mortise is congruent. No acute injury.  No evidence of hardware failure or loosening.    ? ?Impression: healed right trimalleolar ankle fracture; hardware intact.  ? ?Oliver Barre, MD ?11/26/2021 ?7:34 PM ?  ?

## 2022-05-31 ENCOUNTER — Ambulatory Visit (INDEPENDENT_AMBULATORY_CARE_PROVIDER_SITE_OTHER)

## 2022-05-31 ENCOUNTER — Encounter: Payer: Self-pay | Admitting: Orthopedic Surgery

## 2022-05-31 ENCOUNTER — Ambulatory Visit (INDEPENDENT_AMBULATORY_CARE_PROVIDER_SITE_OTHER): Admitting: Orthopedic Surgery

## 2022-05-31 DIAGNOSIS — S82851D Displaced trimalleolar fracture of right lower leg, subsequent encounter for closed fracture with routine healing: Secondary | ICD-10-CM

## 2022-05-31 NOTE — Progress Notes (Signed)
Orthopaedic Postop Note  Assessment: Carol Todd is a 61 y.o. female s/p ORIF of Right ankle fracture  DOS: 05/10/21  Plan: Carol Todd is doing very well.  She is ambulating without difficulty.  She is in a regular shoe.  She notes slight loss of range of motion.  Otherwise, she is doing well.  Radiographs are stable.  Follow-up as needed.   Follow-up: Return if symptoms worsen or fail to improve. XR at next visit: none  Subjective:  Chief Complaint  Patient presents with   Ankle Injury    Right     History of Present Illness: Carol Todd is a 61 y.o. female who returns following the above stated procedure.  Surgery was a little over a year ago.  She is wearing a regular shoe.  She does not have any issues.  She is happy with her progress.  She does note slight loss of range of motion, but this is nonproblematic.  She has no pain in her right ankle.  Review of Systems: No fevers or chills No numbness or tingling No Chest Pain No shortness of breath   Objective: There were no vitals taken for this visit.  Physical Exam:  Alert and oriented.  No acute distress  Nonantalgic gait.  She is wearing regular shoes.  Right foot without swelling.  Surgical incisions are healed.  No numbness or tingling.  She easily gets beyond plantigrade position.  Toes are warm and well-perfused.  Sensation is intact over the dorsum of the foot.  IMAGING: I personally ordered and reviewed the following images  X-rays of the right ankle were obtained in clinic today.  These are compared to prior x-rays.  The mortise is congruent.  Hardware remains intact.  No screws are backing out.  No failure of the hardware.  Fracture lines are not visible.  Slight loss of joint space within the tibiotalar joint, but this is congruent.  No syndesmotic disruption.  Impression: Healed right trimalleolar ankle fracture  Oliver Barre, MD 05/31/2022 10:40 AM

## 2022-11-24 ENCOUNTER — Encounter: Payer: Self-pay | Admitting: Radiology

## 2023-12-18 ENCOUNTER — Encounter: Payer: Self-pay | Admitting: *Deleted

## 2023-12-21 ENCOUNTER — Ambulatory Visit: Attending: Cardiology

## 2023-12-21 ENCOUNTER — Ambulatory Visit: Attending: Cardiology | Admitting: Cardiology

## 2023-12-21 ENCOUNTER — Encounter: Payer: Self-pay | Admitting: Cardiology

## 2023-12-21 ENCOUNTER — Other Ambulatory Visit: Payer: Self-pay | Admitting: Cardiology

## 2023-12-21 VITALS — BP 158/75 | HR 65 | Ht 65.0 in | Wt 198.8 lb

## 2023-12-21 DIAGNOSIS — R002 Palpitations: Secondary | ICD-10-CM | POA: Diagnosis not present

## 2023-12-21 DIAGNOSIS — R011 Cardiac murmur, unspecified: Secondary | ICD-10-CM | POA: Diagnosis not present

## 2023-12-21 DIAGNOSIS — R079 Chest pain, unspecified: Secondary | ICD-10-CM

## 2023-12-21 DIAGNOSIS — I10 Essential (primary) hypertension: Secondary | ICD-10-CM

## 2023-12-21 DIAGNOSIS — R0989 Other specified symptoms and signs involving the circulatory and respiratory systems: Secondary | ICD-10-CM

## 2023-12-21 MED ORDER — DILTIAZEM HCL 30 MG PO TABS: 30.0000 mg | ORAL_TABLET | Freq: Two times a day (BID) | ORAL | 1 refills | Status: DC

## 2023-12-21 NOTE — Patient Instructions (Addendum)
 Medication Instructions:  Your physician has recommended you make the following change in your medication:  Please Stop Lopressor  Please Start Diltiazem 30 Mg Twice daily   Labwork: None   Testing/Procedures: ZIO- Long Term Monitor Instructions   Your physician has requested you wear your ZIO patch monitor 14 days.   This is a single patch monitor.  [employer] supplies one patch monitor per enrollment.  Additional stickers are not available.   Please do not apply patch if you will be having a Nuclear Stress Test, Echocardiogram, Cardiac CT, MRI, or Chest Xray during the time frame you would be wearing the monitor. The patch cannot be worn during these tests.  You cannot remove and re-apply the ZIO XT patch monitor.   Your ZIO patch monitor will be sent USPS Priority mail from [facility] directly to your home address. The monitor may also be mailed to a PO BOX if home delivery is not available.   It may take 3-5 days to receive your monitor after you have been enrolled.   Once you have received you monitor, please review enclosed instructions.  Your monitor has already been registered assigning a specific monitor serial # to you.   Applying the monitor   Shave hair from upper left chest.   Hold abrader disc by orange tab.  Rub abrader in 40 strokes over left upper chest as indicated in your monitor instructions.   Clean area with 4 enclosed alcohol pads .  Use all pads to assure are is cleaned thoroughly.  Let dry.   Apply patch as indicated in monitor instructions.  Patch will be place under collarbone on left side of chest with arrow pointing upward.   Rub patch adhesive wings for 2 minutes.Remove white label marked "1".  Remove white label marked "2".  Rub patch adhesive wings for 2 additional minutes.   While looking in a mirror, press and release button in center of patch.  A small green light will flash 3-4 times .  This will be your only indicator the monitor has been  turned on.     Do not shower for the first 24 hours.  You may shower after the first 24 hours.   Press button if you feel a symptom. You will hear a small click.  Record Date, Time and Symptom in the Patient Log Book.   When you are ready to remove patch, follow instructions on last 2 pages of Patient Log Book.  Stick patch monitor onto last page of Patient Log Book.   Place Patient Log Book in [place] box.  Use locking tab on box and tape box closed securely.  The [employer] has [employer] on it.  Please place in mailbox as soon as possible.  Your physician should have your test results approximately 7 days after the monitor has been mailed back to [facility].   Call [facility] Customer Care at [phone] if you have questions regarding your ZIO XT patch monitor.  Call them immediately if you see an orange light blinking on your monitor.   If your monitor falls off in less than 4 days contact our Monitor department at [phone].  If your monitor becomes loose or falls off after 4 days call [employer] at [phone] for suggestions on securing your monitor. Your physician has requested that you have an echocardiogram. Echocardiography is a painless test that uses sound waves to create images of your heart. It provides your doctor with information about the  size and shape of your heart and how well your heart's chambers and valves are working. This procedure takes approximately one hour. There are no restrictions for this procedure. Please do NOT wear cologne, perfume, aftershave, or lotions (deodorant is allowed). Please arrive 15 minutes prior to your appointment time.  Please note: We ask at that you not bring children with you during ultrasound (echo/ vascular) testing. Due to room size and safety concerns, children are not allowed in the ultrasound rooms during exams. Our front office staff cannot provide observation of children in our lobby area while testing is being  conducted. An adult accompanying a patient to their appointment will only be allowed in the ultrasound room at the discretion of the ultrasound technician under special circumstances. We apologize for any inconvenience. Your physician has requested that you have a carotid duplex. This test is an ultrasound of the carotid arteries in your neck. It looks at blood flow through these arteries that supply the brain with blood. Allow one hour for this exam. There are no restrictions or special instructions.  Follow-Up: Your physician recommends that you schedule a follow-up appointment in: 4 weeks   Any Other Special Instructions Will Be Listed Below (If Applicable).  If you need a refill on your cardiac medications before your next appointment, please call your pharmacy.

## 2023-12-21 NOTE — Progress Notes (Signed)
 Clinical Summary Carol Todd is a 63 y.o.female  1.Chest pain/palpitations - ER visit 10/2023 [facility] with chest pain - had flu 3 weeks prior - reported left sided chest pains and palpitations. Had been taking bc powders for headache - trops neg, EKG no acute ischemic changes, from report had some PACs and short run SVT -startred on metoprolol, atarax for anxiety.   - ongoing fatigue since the flu - left sided, pressure like feeling. 4/10 in severity. Would occur at rest or with activity. Worst with deep breathing. Would last up to 30 minutes. No other associated symptoms. Separate from the pain was palpitations. Palpitations would tend to occur 3-4 times a year. Palpitations last a few minutes.  - takes bc powders every morning, has since stopped - she reports tender to palpation in ER  - palpitations mild improved. Had been taking metoprolol 50mg  bid, low HRs and lightheaded - chest pressure has resolved.    2.Migraine headaches - reports long history  3. HTN - recently started on valsartan - home bp's variable 110s-140s/60s-80s   4. Generalized fatigue - generalized weakness, can feel palpitations - has lost 20 lbs, ongoing nausea, low appetite.   Past Medical History:  Diagnosis Date   Anxiety    Depressed    Hypertension      No Known Allergies   Current Outpatient Medications  Medication Sig Dispense Refill   ALPRAZolam (XANAX) 0.25 MG tablet Take 0.25 mg by mouth at bedtime as needed.     FLUoxetine (PROZAC) 10 MG tablet Take 10 mg by mouth daily.     valsartan (DIOVAN) 80 MG tablet Take 80 mg by mouth daily.     Black Pepper-Turmeric (TURMERIC COMPLEX/BLACK PEPPER PO) Take by mouth.     co-enzyme Q-10 30 MG capsule Take 30 mg by mouth 3 (three) times daily.     Coconut Oil 1000 MG CAPS Take 1 capsule by mouth daily.     Multiple Vitamin (MULTIVITAMIN WITH MINERALS) TABS tablet Take 1 tablet by mouth daily.     St Johns Wort 1000 MG CAPS Take  1 capsule by mouth daily.     No current facility-administered medications for this visit.     Past Surgical History:  Procedure Laterality Date   INNER EAR SURGERY     ORIF ANKLE FRACTURE Right 05/10/2021   Procedure: OPEN REDUCTION INTERNAL FIXATION (ORIF) ANKLE FRACTURE;  Surgeon: Oliver Barre, MD;  Location: [facility];  Service: Orthopedics;  Laterality: Right;  Trimalleolar ankle fracture   TONSILLECTOMY     TUBAL LIGATION       No Known Allergies    No family history on file.   Social History Ms. Aliberti reports that she has been smoking cigarettes. She has never used smokeless tobacco. Ms. Gregg reports current alcohol use.    Physical Examination Today's Vitals   12/21/23 1040 12/21/23 1048 12/21/23 1118  BP: (!) 170/84 (!) 158/88 (!) 158/75  Pulse: 65    SpO2: 96%    Weight: 198 lb 12.8 oz (90.2 kg)    Height: 5\' 5"  (1.651 m)    PainSc: 5     PainLoc: Head     Body mass index is 33.08 kg/m.  Gen: resting comfortably, no acute distress HEENT: no scleral icterus, pupils equal round and reactive, no palptable cervical adenopathy,  CV: RRR, 2/6 systolic murur rusb. Bilateral carotid bruits Resp: Clear to auscultation bilaterally GI: abdomen is soft, non-tender, non-distended, normal bowel sounds,  no hepatosplenomegaly MSK: extremities are warm, no edema.  Skin: warm, no rash Neuro:  no focal deficits Psych: appropriate affect     Assessment and Plan  1.Chest pain - atypical symptoms that have since resolved, from history was tender to palpation and worst with deep breathing - no further cardiac workup indicated  2. Palpitations - ongoing symptoms. EKG during ER visit showed short episode of SVT, would suspect the etiology of her symptoms - did not tolerate higher doses of lopressor due to low bp's and HRs.  - with ongonig symptoms will plan for 2 week monitor, if high burden of SVT or other arrhythmias may warrant consideration for antiarrhythymic  given limited tolerance to av nodal agents and ongoing symptoms - generalized fatigue unclear etiology, possibly related to lopressor. Will change to diltiazem 30mg  bid - EKG today shows NSR  3. HTN - elevated here but active migraine headaches - home numbers variable but overall reasonable, continue current meds   4. Heart mumur - oibtain echo  5. Carotid bruits - obtain carotid US     Antoine Poche, M.D.

## 2023-12-27 ENCOUNTER — Encounter: Payer: Self-pay | Admitting: Cardiology

## 2024-01-04 NOTE — Telephone Encounter (Signed)
 Can we try increasing the diltaizem to 60mg  bid to see if helps with the symptosm of heart pounding  Dominga Ferry MD

## 2024-01-08 ENCOUNTER — Ambulatory Visit: Attending: Cardiology

## 2024-01-08 ENCOUNTER — Ambulatory Visit (INDEPENDENT_AMBULATORY_CARE_PROVIDER_SITE_OTHER)

## 2024-01-08 DIAGNOSIS — R011 Cardiac murmur, unspecified: Secondary | ICD-10-CM

## 2024-01-08 DIAGNOSIS — R0989 Other specified symptoms and signs involving the circulatory and respiratory systems: Secondary | ICD-10-CM | POA: Diagnosis not present

## 2024-01-08 MED ORDER — PERFLUTREN LIPID MICROSPHERE
1.0000 mL | INTRAVENOUS | Status: AC | PRN
  Administered 2024-01-08: 2 mL via INTRAVENOUS

## 2024-01-09 LAB — ECHOCARDIOGRAM COMPLETE
AR max vel: 1.97 cm2
AV Area VTI: 3.17 cm2
AV Area mean vel: 2.29 cm2
AV Mean grad: 5 mmHg
AV Peak grad: 12.7 mmHg
Ao pk vel: 1.78 m/s
Area-P 1/2: 3.89 cm2
Calc EF: 73.3 %
Est EF: >75
S' Lateral: 1.9 cm
Single Plane A2C EF: 72.2 %
Single Plane A4C EF: 72.4 %

## 2024-01-15 ENCOUNTER — Encounter: Payer: Self-pay | Admitting: *Deleted

## 2024-01-18 ENCOUNTER — Ambulatory Visit: Admitting: Nurse Practitioner

## 2024-01-19 ENCOUNTER — Encounter: Payer: Self-pay | Admitting: Student

## 2024-01-19 ENCOUNTER — Ambulatory Visit: Attending: Student | Admitting: Student

## 2024-01-19 VITALS — BP 142/78 | HR 95 | Ht 65.0 in | Wt 188.0 lb

## 2024-01-19 DIAGNOSIS — R002 Palpitations: Secondary | ICD-10-CM

## 2024-01-19 DIAGNOSIS — E785 Hyperlipidemia, unspecified: Secondary | ICD-10-CM | POA: Diagnosis not present

## 2024-01-19 DIAGNOSIS — I10 Essential (primary) hypertension: Secondary | ICD-10-CM

## 2024-01-19 DIAGNOSIS — I6523 Occlusion and stenosis of bilateral carotid arteries: Secondary | ICD-10-CM | POA: Diagnosis not present

## 2024-01-19 MED ORDER — DILTIAZEM HCL 60 MG PO TABS: 60.0000 mg | ORAL_TABLET | Freq: Two times a day (BID) | ORAL | 3 refills | Status: AC

## 2024-01-19 MED ORDER — VALSARTAN 80 MG PO TABS: 80.0000 mg | ORAL_TABLET | Freq: Every day | ORAL | 3 refills | Status: AC

## 2024-01-19 NOTE — Progress Notes (Signed)
 Cardiology Office Note    Date:  01/19/2024  ID:  Carol Todd, DOB 07/14/1961, MRN [id number] Cardiologist: Armida Lander, MD    History of Present Illness:    Carol Todd is a 63 y.o. female with past medical history of palpitations (prior reports of PACs and short burst of SVT), HTN and migraine headaches who presents to the office today for 1 month follow-up.  She was examined by Dr. Amanda Jungling in 11/2023 as a new patient referral and had recently been evaluated the ED for chest pain and palpitations. Workup had overall been reassuring at that time and troponin values were negative. She had been taking Lopressor 50 mg twice daily but reported episodes of bradycardia and hypotension with this. A 2-week event monitor was recommended for further assessment and Lopressor was discontinued with her being started on short-acting Cardizem  30 mg twice daily. She did have bilateral carotid bruits on examination along with a murmur and echocardiogram and carotid dopplers were recommended for further assessment. Her echocardiogram showed a hyperdynamic LVEF greater than 75% with no wall motion abnormalities. She did have grade 1 diastolic dysfunction, normal RV function and no significant valve abnormalities. The IVC was small suggesting low RV pressure and hypovolemia. Carotid dopplers showed 1-39% RICA stenosis and 40-59% LICA stenosis. Her event monitor showed predominantly normal sinus rhythm with an average heart rate of 69 bpm. She did have 3 episodes of SVT with the longest lasting for 8 beats. She did have rare PAC's and PVC's with a less than 1% burden.  She did reach out in the interim reporting still having episodes of her heart pounding and was recommended to increase short-acting Cardizem  to 60 mg twice daily. She also reported unintentional weight loss and a decreased appetite. It was felt that symptoms were possibly due to initiation of Prozac by her PCP and less likely to be due to  Valsartan  or Diltiazem .  In talking with the patient and her daughter today, she reports that her palpitations have improved since increasing Cardizem  to 60 mg twice daily last week. Still has occasional, brief episodes but no persistent symptoms. No recurrent chest pain. Breathing has overall been stable and no orthopnea, PND or pitting edema. She reports still having unintentional weight loss and a decreased appetite and says that her PCP actually increased her Prozac to see if this would help with symptoms but she has not noticed any significant change. She does have scheduled follow-up in the next few weeks to readdress.  Studies Reviewed:   EKG: EKG is not ordered today.   Echocardiogram: 12/2023 IMPRESSIONS     1. Left ventricular ejection fraction, by estimation, is >75%. The left  ventricle has hyperdynamic function. The left ventricle has no regional  wall motion abnormalities. Left ventricular diastolic parameters are  consistent with Grade I diastolic  dysfunction (impaired relaxation).   2. Right ventricular systolic function is normal. The right ventricular  size is normal. Tricuspid regurgitation signal is inadequate for assessing  PA pressure.   3. The mitral valve is normal in structure. No evidence of mitral valve  regurgitation. No evidence of mitral stenosis.   4. The aortic valve is tricuspid. Aortic valve regurgitation is not  visualized. No aortic stenosis is present.   5. IVC is small suggesting low RA pressure and hypovolemia.   Comparison(s): No prior Echocardiogram.   Event Monitor: 12/2023 Patch Wear Time:  13 days and 23 hours (2025-03-27T11:28:35-0400 to 2025-04-10T11:28:27-0400)   Patient had a min HR of  42 bpm, max HR of 200 bpm, and avg HR of 69 bpm. Predominant underlying rhythm was Sinus Rhythm. 3 Supraventricular Tachycardia runs occurred, the run with the fastest interval lasting 8 beats with a max rate of 200 bpm (avg 172  bpm); the run with the  fastest interval was also the longest. Isolated SVEs were rare (<1.0%), SVE Couplets were rare (<1.0%), and SVE Triplets were rare (<1.0%). Isolated VEs were rare (<1.0%), and no VE Couplets or VE Triplets were present.  Carotid Dopplers: 12/2023 Summary:  Right Carotid: Velocities in the right ICA are consistent with a 1-39%  stenosis.                Non-hemodynamically significant plaque <50% noted in the  CCA. The                 ECA appears <50% stenosed.   Left Carotid: Velocities in the left ICA are consistent with a 40-59%  stenosis.    Risk Assessment/Calculations:     HYPERTENSION CONTROL Vitals:   01/19/24 1532 01/19/24 1559  BP: (!) 158/62 (!) 142/78    The patient's blood pressure is elevated above target today.  In order to address the patient's elevated BP: Blood pressure will be monitored at home to determine if medication changes need to be made.            Physical Exam:   VS:  BP (!) 142/78 (BP Location: Right Arm, Patient Position: Sitting, Cuff Size: Normal)   Pulse 95   Ht 5\' 5"  (1.651 m)   Wt 188 lb (85.3 kg)   SpO2 97%   BMI 31.28 kg/m    Wt Readings from Last 3 Encounters:  01/19/24 188 lb (85.3 kg)  12/21/23 198 lb 12.8 oz (90.2 kg)  05/21/21 199 lb (90.3 kg)     GEN: Well nourished, well developed female appearing in no acute distress NECK: No JVD; Bilateral carotid bruits CARDIAC: RRR, soft systolic murmur along right sternal border.  RESPIRATORY:  Clear to auscultation without rales, wheezing or rhonchi  ABDOMEN: Appears non-distended. No obvious abdominal masses. EXTREMITIES: No clubbing or cyanosis. No pitting edema.  Distal pedal pulses are 2+ bilaterally.   Assessment and Plan:   1. Palpitations - The preliminary report from her recent monitor as discussed above only showed brief episodes of SVT and rare PAC's and PVC's. Labs from [facility] earlier this month showed Hgb was at 16, K+ at 4.0 and TSH 1.46. - Reviewed options  with the patient today and she recently titrated short-acting Cardizem  from 30 mg twice daily to 60 mg twice daily. Will continue with current medical therapy for now. If this controls her symptoms well, could switch to Cardizem  CD 120 mg daily in the future.  2. HTN - BP was initially recorded at 158/62, rechecked and improved to 142/78. She reports this has overall been well-controlled at home with SBP in the 110's to 130's. Given that she recently increased Cardizem  last week, will continue current medical therapy for now with short-acting Cardizem  60 mg twice daily and Valsartan  80 mg daily. Could further titrate Valsartan  in the future if needed.  3. HLD - LDL was at 142 when checked by her PCP recently. She prefers to focus on dietary changes initially and given her concerns of medication side effects as discussed above, will hold off on additional medications for now. Will recheck an FLP in 3 months. If LDL remains above goal of 70 (given carotid artery  stenosis), would plan to add low-dose Crestor.  4. Carotid Artery Stenosis - Dopplers earlier this month showed 1-39% RICA stenosis and 40-59% LICA stenosis. Would plan for follow-up imaging in 1 year for reassessment.   Signed, Dorma Gash, PA-C

## 2024-01-19 NOTE — Patient Instructions (Signed)
 Medication Instructions:  Your physician recommends that you continue on your current medications as directed. Please refer to the Current Medication list given to you today.  *If you need a refill on your cardiac medications before your next appointment, please call your pharmacy*  Lab Work: Fasting Lipid Panel   If you have labs (blood work) drawn today and your tests are completely normal, you will receive your results only by: MyChart Message (if you have MyChart) OR A paper copy in the mail If you have any lab test that is abnormal or we need to change your treatment, we will call you to review the results.  Testing/Procedures: None  Follow-Up: At [facility], you and your health needs are our priority.  As part of our continuing mission to provide you with exceptional heart care, our providers are all part of one team.  This team includes your primary Cardiologist (physician) and Advanced Practice Providers or APPs (Physician Assistants and Nurse Practitioners) who all work together to provide you with the care you need, when you need it.  Your next appointment:   3-4 month(s)  Provider:   You may see Armida Lander, MD or one of the following Advanced Practice Providers on your designated Care Team:   Woodfin Hays, PA-C  [place], [place] Theotis Flake, [place]     We recommend signing up for the patient portal called "MyChart".  Sign up information is provided on this After Visit Summary.  MyChart is used to connect with patients for Virtual Visits (Telemedicine).  Patients are able to view lab/test results, encounter notes, upcoming appointments, etc.  Non-urgent messages can be sent to your provider as well.   To learn more about what you can do with MyChart, go to [email or web address].   Other Instructions

## 2024-02-12 DIAGNOSIS — R002 Palpitations: Secondary | ICD-10-CM | POA: Diagnosis not present

## 2024-02-29 ENCOUNTER — Ambulatory Visit: Payer: Self-pay | Admitting: Cardiology

## 2024-04-15 ENCOUNTER — Ambulatory Visit: Payer: Self-pay | Admitting: Cardiology

## 2024-04-16 ENCOUNTER — Ambulatory Visit: Payer: Self-pay | Admitting: Student

## 2024-04-16 LAB — LIPID PANEL
Chol/HDL Ratio: 6.1 ratio — ABNORMAL HIGH (ref 0.0–4.4)
Cholesterol, Total: 268 mg/dL — ABNORMAL HIGH (ref 100–199)
HDL: 44 mg/dL (ref >39)
LDL Chol Calc (NIH): 187 mg/dL — ABNORMAL HIGH (ref 0–99)
Triglycerides: 194 mg/dL — ABNORMAL HIGH (ref 0–149)
VLDL Cholesterol Cal: 37 mg/dL (ref 5–40)

## 2024-04-24 ENCOUNTER — Ambulatory Visit: Attending: Cardiology | Admitting: Cardiology

## 2024-04-24 ENCOUNTER — Encounter: Payer: Self-pay | Admitting: Cardiology

## 2024-04-24 VITALS — BP 148/84 | HR 87 | Ht 65.0 in | Wt 180.6 lb

## 2024-04-24 DIAGNOSIS — E782 Mixed hyperlipidemia: Secondary | ICD-10-CM

## 2024-04-24 DIAGNOSIS — I6523 Occlusion and stenosis of bilateral carotid arteries: Secondary | ICD-10-CM | POA: Diagnosis not present

## 2024-04-24 DIAGNOSIS — I10 Essential (primary) hypertension: Secondary | ICD-10-CM | POA: Diagnosis not present

## 2024-04-24 DIAGNOSIS — R002 Palpitations: Secondary | ICD-10-CM | POA: Diagnosis not present

## 2024-04-24 MED ORDER — ASPIRIN 81 MG PO TBEC: 81.0000 mg | DELAYED_RELEASE_TABLET | Freq: Every day | ORAL | Status: AC

## 2024-04-24 MED ORDER — ROSUVASTATIN CALCIUM 10 MG PO TABS: 10.0000 mg | ORAL_TABLET | Freq: Every day | ORAL | 6 refills | Status: DC

## 2024-04-24 NOTE — Patient Instructions (Signed)
 Medication Instructions:   Begin Crestor  10mg  daily Begin Aspirin  81mg  daily  Continue all other medications.     Labwork:  none  Testing/Procedures:  none  Follow-Up:  6 months   Any Other Special Instructions Will Be Listed Below (If Applicable).   If you need a refill on your cardiac medications before your next appointment, please call your pharmacy.

## 2024-04-24 NOTE — Progress Notes (Signed)
 Clinical Summary Carol Todd is a 63 y.o.female seen today for follow up of the following medical problems.   1.Chest pain/palpitations - ER visit 10/2023 [facility] with chest pain - had flu 3 weeks prior - reported left sided chest pains and palpitations. Had been taking bc powders for headache - trops neg, EKG no acute ischemic changes, from report had some PACs and short run SVT -startred on metoprolol, atarax for anxiety.    - ongoing fatigue since the flu - left sided, pressure like feeling. 4/10 in severity. Would occur at rest or with activity. Worst with deep breathing. Would last up to 30 minutes. No other associated symptoms. Separate from the pain was palpitations. Palpitations would tend to occur 3-4 times a year. Palpitations last a few minutes.  - takes bc powders every morning, has since stopped - she reports tender to palpation in ER   - palpitations mild improved. Had been taking metoprolol 50mg  bid, low HRs and lightheaded - chest pressure has resolved.     12/2023 monitor: rare PACs, runs SVT lognest 8 beats. Rare PVCs 12/2023 echo: LVEF >75%, no WMAs, grade I dd,  Generalized fatigue of unclear etiology, we changed her lopressor to diltiazem  - no recent palpitations.     2.Migraine headaches - reports long history   3. HTN - home bp's 120s-130s/60s-70s     4. Carotid stenosis - 12/2023 US : RICA 1-39%, LICA 40-59%   6. HLD 03/2024 TC 268 TG 194 HDL 44 LDL 187 - ASCVD risk 16% based on today's calculations.      Past Medical History:  Diagnosis Date   Anxiety    Depressed    Hypertension 11/10/2023     No Known Allergies   Current Outpatient Medications  Medication Sig Dispense Refill   ALPRAZolam (XANAX) 0.25 MG tablet Take 0.25 mg by mouth at bedtime as needed.     diltiazem  (CARDIZEM ) 60 MG tablet Take 1 tablet (60 mg total) by mouth 2 (two) times daily. 180 tablet 3   FLUoxetine (PROZAC) 10 MG tablet Take 20 mg by mouth daily.      omeprazole (PRILOSEC) 40 MG capsule Take 40 mg by mouth daily. (Patient taking differently: Take 40 mg by mouth daily as needed.)     valsartan  (DIOVAN ) 80 MG tablet Take 1 tablet (80 mg total) by mouth daily. 90 tablet 3   No current facility-administered medications for this visit.     Past Surgical History:  Procedure Laterality Date   INNER EAR SURGERY     ORIF ANKLE FRACTURE Right 05/10/2021   Procedure: OPEN REDUCTION INTERNAL FIXATION (ORIF) ANKLE FRACTURE;  Surgeon: Onesimo Oneil LABOR, MD;  Location: [facility];  Service: Orthopedics;  Laterality: Right;  Trimalleolar ankle fracture   TONSILLECTOMY     TUBAL LIGATION       No Known Allergies    Family History  Problem Relation Age of Onset   Hyperlipidemia Father    Hypertension Father      Social History Ms. Carol Todd reports that she has been smoking cigarettes. She has never used smokeless tobacco. Ms. Carol Todd reports current alcohol use.    Physical Examination Today's Vitals   04/24/24 1400 04/24/24 1430  BP: (!) 158/98 (!) 148/84  Pulse: 87   SpO2: 97%   Weight: 180 lb 9.6 oz (81.9 kg)   Height: 5' 5 (1.651 m)    Body mass index is 30.05 kg/m.  Gen: resting comfortably, no acute distress HEENT:  no scleral icterus, pupils equal round and reactive, no palptable cervical adenopathy,  CV: RRR, 2/6 systolic murmur rusb, bilatral carotid bruits Resp: Clear to auscultation bilaterally GI: abdomen is soft, non-tender, non-distended, normal bowel sounds, no hepatosplenomegaly MSK: extremities are warm, no edema.  Skin: warm, no rash Neuro:  no focal deficits Psych: appropriate affect   Diagnostic Studies  12/2023 monitor  14 day monitor   Rare supraventricular ectopy in the form of isolated PACs, couplets, triplets. 3 runs of SVT, longest 8 beats.   Rare ventricular ectopy in the form of isolated PVCs   No reported symptoms  12/2023 carotid US  Summary:  Right Carotid: Velocities in the right ICA are  consistent with a 1-39%  stenosis.                Non-hemodynamically significant plaque <50% noted in the  CCA. The                 ECA appears <50% stenosed.   Left Carotid: Velocities in the left ICA are consistent with a 40-59%  stenosis.               Non-hemodynamically significant plaque <50% noted in the  CCA. The                ECA appears <50% stenosed. ICA stenosis at the lower end of  the               spectrum.   Vertebrals:  Bilateral vertebral arteries demonstrate antegrade flow.  Subclavians: Normal flow hemodynamics were seen in bilateral subclavian               arteries.    12/2023 echo 1. Left ventricular ejection fraction, by estimation, is >75%. The left  ventricle has hyperdynamic function. The left ventricle has no regional  wall motion abnormalities. Left ventricular diastolic parameters are  consistent with Grade I diastolic  dysfunction (impaired relaxation).   2. Right ventricular systolic function is normal. The right ventricular  size is normal. Tricuspid regurgitation signal is inadequate for assessing  PA pressure.   3. The mitral valve is normal in structure. No evidence of mitral valve  regurgitation. No evidence of mitral stenosis.   4. The aortic valve is tricuspid. Aortic valve regurgitation is not  visualized. No aortic stenosis is present.   5. IVC is small suggesting low RA pressure and hypovolemia.   Comparison(s): No prior Echocardiogram.   Assessment and Plan   1.Chest pain - atypical symptoms that have since resolved, from history was tender to palpation and worst with deep breathing - no further cardiac workup indicated   2. Palpitations -doing well on diltiazem , did not tolerate lopressor well - continue current meds   3. HTN -elevated in clinic but home numbers at goal, continue current meds     4. HLD - start crestor  10mg  daily  5. Carotid stenosis - repeat carotid US  1 year     Carol Todd, M.D.,

## 2024-04-25 ENCOUNTER — Ambulatory Visit: Admitting: Cardiology

## 2024-10-15 ENCOUNTER — Other Ambulatory Visit: Payer: Self-pay | Admitting: Cardiology

## 2025-01-02 ENCOUNTER — Ambulatory Visit: Admitting: Cardiology
